# Patient Record
Sex: Female | Born: 1960 | Race: White | Hispanic: No | Smoking: Current every day smoker
Health system: Southern US, Community
[De-identification: ages and names within clinical notes are randomized; demographics above are authoritative.]

## PROBLEM LIST (undated history)

## (undated) DIAGNOSIS — Z8489 Family history of other specified conditions: Secondary | ICD-10-CM

## (undated) DIAGNOSIS — R519 Headache, unspecified: Secondary | ICD-10-CM

## (undated) DIAGNOSIS — E119 Type 2 diabetes mellitus without complications: Secondary | ICD-10-CM

## (undated) DIAGNOSIS — M1990 Unspecified osteoarthritis, unspecified site: Secondary | ICD-10-CM

## (undated) DIAGNOSIS — E7800 Pure hypercholesterolemia, unspecified: Secondary | ICD-10-CM

## (undated) DIAGNOSIS — Z972 Presence of dental prosthetic device (complete) (partial): Secondary | ICD-10-CM

## (undated) DIAGNOSIS — J189 Pneumonia, unspecified organism: Secondary | ICD-10-CM

## (undated) DIAGNOSIS — I10 Essential (primary) hypertension: Secondary | ICD-10-CM

## (undated) HISTORY — PX: COLONOSCOPY: SHX174

## (undated) HISTORY — PX: CHOLECYSTECTOMY: SHX55

## (undated) HISTORY — PX: ESOPHAGOGASTRODUODENOSCOPY: SHX1529

---

## 1995-08-09 HISTORY — PX: ABDOMINAL HYSTERECTOMY: SHX81

## 2005-08-08 HISTORY — PX: BREAST EXCISIONAL BIOPSY: SUR124

## 2011-08-09 HISTORY — PX: BREAST BIOPSY: SHX20

## 2019-01-03 ENCOUNTER — Other Ambulatory Visit: Payer: Self-pay | Admitting: Nurse Practitioner

## 2019-01-03 DIAGNOSIS — Z1231 Encounter for screening mammogram for malignant neoplasm of breast: Secondary | ICD-10-CM

## 2019-02-14 ENCOUNTER — Other Ambulatory Visit: Payer: Self-pay

## 2019-02-14 ENCOUNTER — Ambulatory Visit
Admission: RE | Admit: 2019-02-14 | Discharge: 2019-02-14 | Disposition: A | Discharge status: Home / Self Care | Payer: 59 | Source: Ambulatory Visit | Attending: Nurse Practitioner | Admitting: Nurse Practitioner

## 2019-02-14 DIAGNOSIS — Z1231 Encounter for screening mammogram for malignant neoplasm of breast: Secondary | ICD-10-CM | POA: Diagnosis present

## 2019-06-21 ENCOUNTER — Other Ambulatory Visit: Payer: Self-pay

## 2019-06-21 ENCOUNTER — Emergency Department: Payer: 59

## 2019-06-21 ENCOUNTER — Inpatient Hospital Stay: Payer: 59

## 2019-06-21 ENCOUNTER — Inpatient Hospital Stay
Admission: EM | Admit: 2019-06-21 | Discharge: 2019-06-25 | DRG: 872 | Disposition: A | Discharge status: Home / Self Care | Payer: 59 | Attending: Internal Medicine | Admitting: Internal Medicine

## 2019-06-21 DIAGNOSIS — Z6832 Body mass index (BMI) 32.0-32.9, adult: Secondary | ICD-10-CM | POA: Diagnosis not present

## 2019-06-21 DIAGNOSIS — B950 Streptococcus, group A, as the cause of diseases classified elsewhere: Secondary | ICD-10-CM | POA: Diagnosis present

## 2019-06-21 DIAGNOSIS — R030 Elevated blood-pressure reading, without diagnosis of hypertension: Secondary | ICD-10-CM | POA: Diagnosis present

## 2019-06-21 DIAGNOSIS — E669 Obesity, unspecified: Secondary | ICD-10-CM | POA: Diagnosis present

## 2019-06-21 DIAGNOSIS — J189 Pneumonia, unspecified organism: Secondary | ICD-10-CM | POA: Diagnosis not present

## 2019-06-21 DIAGNOSIS — Z803 Family history of malignant neoplasm of breast: Secondary | ICD-10-CM | POA: Diagnosis not present

## 2019-06-21 DIAGNOSIS — E119 Type 2 diabetes mellitus without complications: Secondary | ICD-10-CM | POA: Diagnosis present

## 2019-06-21 DIAGNOSIS — Z880 Allergy status to penicillin: Secondary | ICD-10-CM | POA: Diagnosis not present

## 2019-06-21 DIAGNOSIS — E785 Hyperlipidemia, unspecified: Secondary | ICD-10-CM | POA: Diagnosis present

## 2019-06-21 DIAGNOSIS — Z20828 Contact with and (suspected) exposure to other viral communicable diseases: Secondary | ICD-10-CM | POA: Diagnosis present

## 2019-06-21 DIAGNOSIS — E860 Dehydration: Secondary | ICD-10-CM | POA: Diagnosis present

## 2019-06-21 DIAGNOSIS — Z87891 Personal history of nicotine dependence: Secondary | ICD-10-CM

## 2019-06-21 DIAGNOSIS — H9319 Tinnitus, unspecified ear: Secondary | ICD-10-CM | POA: Diagnosis present

## 2019-06-21 DIAGNOSIS — E7800 Pure hypercholesterolemia, unspecified: Secondary | ICD-10-CM | POA: Diagnosis present

## 2019-06-21 DIAGNOSIS — R519 Headache, unspecified: Secondary | ICD-10-CM

## 2019-06-21 DIAGNOSIS — Z882 Allergy status to sulfonamides status: Secondary | ICD-10-CM

## 2019-06-21 DIAGNOSIS — E861 Hypovolemia: Secondary | ICD-10-CM | POA: Diagnosis present

## 2019-06-21 DIAGNOSIS — A409 Streptococcal sepsis, unspecified: Principal | ICD-10-CM | POA: Diagnosis present

## 2019-06-21 DIAGNOSIS — R3129 Other microscopic hematuria: Secondary | ICD-10-CM

## 2019-06-21 DIAGNOSIS — J020 Streptococcal pharyngitis: Secondary | ICD-10-CM | POA: Diagnosis present

## 2019-06-21 DIAGNOSIS — A419 Sepsis, unspecified organism: Secondary | ICD-10-CM

## 2019-06-21 HISTORY — DX: Pure hypercholesterolemia, unspecified: E78.00

## 2019-06-21 HISTORY — DX: Essential (primary) hypertension: I10

## 2019-06-21 HISTORY — DX: Type 2 diabetes mellitus without complications: E11.9

## 2019-06-21 LAB — COMPREHENSIVE METABOLIC PANEL
ALT: 23 U/L (ref 0–44)
AST: 25 U/L (ref 15–41)
Albumin: 4.5 g/dL (ref 3.5–5.0)
Alkaline Phosphatase: 86 U/L (ref 38–126)
Anion gap: 12 (ref 5–15)
BUN: 12 mg/dL (ref 6–20)
CO2: 22 mmol/L (ref 22–32)
Calcium: 9.5 mg/dL (ref 8.9–10.3)
Chloride: 104 mmol/L (ref 98–111)
Creatinine, Ser: 0.76 mg/dL (ref 0.44–1.00)
GFR calc Af Amer: >60 mL/min (ref >60)
GFR calc non Af Amer: >60 mL/min (ref >60)
Glucose, Bld: 149 mg/dL — ABNORMAL HIGH (ref 70–99)
Potassium: 3.8 mmol/L (ref 3.5–5.1)
Sodium: 138 mmol/L (ref 135–145)
Total Bilirubin: 1.2 mg/dL (ref 0.3–1.2)
Total Protein: 8.1 g/dL (ref 6.5–8.1)

## 2019-06-21 LAB — CBC WITH DIFFERENTIAL/PLATELET
Abs Immature Granulocytes: 0.09 10*3/uL — ABNORMAL HIGH (ref 0.00–0.07)
Basophils Absolute: 0 10*3/uL (ref 0.0–0.1)
Basophils Relative: 0 %
Eosinophils Absolute: 0 10*3/uL (ref 0.0–0.5)
Eosinophils Relative: 0 %
HCT: 41.9 % (ref 36.0–46.0)
Hemoglobin: 14.2 g/dL (ref 12.0–15.0)
Immature Granulocytes: 1 %
Lymphocytes Relative: 12 %
Lymphs Abs: 2.1 10*3/uL (ref 0.7–4.0)
MCH: 30.9 pg (ref 26.0–34.0)
MCHC: 33.9 g/dL (ref 30.0–36.0)
MCV: 91.1 fL (ref 80.0–100.0)
Monocytes Absolute: 1.1 10*3/uL — ABNORMAL HIGH (ref 0.1–1.0)
Monocytes Relative: 7 %
Neutro Abs: 13.8 10*3/uL — ABNORMAL HIGH (ref 1.7–7.7)
Neutrophils Relative %: 80 %
Platelets: 243 10*3/uL (ref 150–400)
RBC: 4.6 MIL/uL (ref 3.87–5.11)
RDW: 12.5 % (ref 11.5–15.5)
WBC: 17.1 10*3/uL — ABNORMAL HIGH (ref 4.0–10.5)
nRBC: 0 % (ref 0.0–0.2)

## 2019-06-21 LAB — URINALYSIS, COMPLETE (UACMP) WITH MICROSCOPIC
Bacteria, UA: NONE SEEN
Bilirubin Urine: NEGATIVE
Glucose, UA: NEGATIVE mg/dL
Hgb urine dipstick: NEGATIVE
Ketones, ur: NEGATIVE mg/dL
Leukocytes,Ua: NEGATIVE
Nitrite: NEGATIVE
Protein, ur: NEGATIVE mg/dL
Specific Gravity, Urine: 1.016 (ref 1.005–1.030)
WBC, UA: NONE SEEN WBC/hpf (ref 0–5)
pH: 7 (ref 5.0–8.0)

## 2019-06-21 LAB — LACTIC ACID, PLASMA
Lactic Acid, Venous: 1.8 mmol/L (ref 0.5–1.9)
Lactic Acid, Venous: 1.4 mmol/L (ref 0.5–1.9)

## 2019-06-21 LAB — PROTIME-INR
INR: 1 (ref 0.8–1.2)
Prothrombin Time: 13 seconds (ref 11.4–15.2)

## 2019-06-21 LAB — INFLUENZA PANEL BY PCR (TYPE A & B)
Influenza A By PCR: NEGATIVE
Influenza B By PCR: NEGATIVE

## 2019-06-21 LAB — MONONUCLEOSIS SCREEN: Mono Screen: NEGATIVE

## 2019-06-21 LAB — TROPONIN I (HIGH SENSITIVITY): Troponin I (High Sensitivity): 5 ng/L (ref <18)

## 2019-06-21 LAB — GROUP A STREP BY PCR: Group A Strep by PCR: DETECTED — AB

## 2019-06-21 LAB — BRAIN NATRIURETIC PEPTIDE: B Natriuretic Peptide: 27 pg/mL (ref 0.0–100.0)

## 2019-06-21 MED ORDER — SODIUM CHLORIDE 0.9 % IV SOLN
2.0000 g | INTRAVENOUS | Status: DC
  Administered 2019-06-22 – 2019-06-24 (×3): 2 g via INTRAVENOUS
  Filled 2019-06-21: qty 2
  Filled 2019-06-21: qty 20
  Filled 2019-06-21 (×2): qty 2

## 2019-06-21 MED ORDER — SODIUM CHLORIDE 0.9 % IV SOLN
500.0000 mg | INTRAVENOUS | Status: DC
  Administered 2019-06-22 – 2019-06-24 (×3): 500 mg via INTRAVENOUS
  Filled 2019-06-21 (×4): qty 500

## 2019-06-21 MED ORDER — GUAIFENESIN ER 600 MG PO TB12
600.0000 mg | ORAL_TABLET | Freq: Two times a day (BID) | ORAL | Status: DC
  Administered 2019-06-22 – 2019-06-25 (×6): 600 mg via ORAL
  Filled 2019-06-21 (×7): qty 1

## 2019-06-21 MED ORDER — ONDANSETRON HCL 4 MG/2ML IJ SOLN
4.0000 mg | Freq: Four times a day (QID) | INTRAMUSCULAR | Status: DC | PRN
  Administered 2019-06-22 – 2019-06-23 (×2): 4 mg via INTRAVENOUS
  Filled 2019-06-21 (×2): qty 2

## 2019-06-21 MED ORDER — SODIUM CHLORIDE 0.9 % IV SOLN
1.0000 g | Freq: Once | INTRAVENOUS | Status: AC
  Administered 2019-06-21: 1 g via INTRAVENOUS
  Filled 2019-06-21: qty 10

## 2019-06-21 MED ORDER — TRAZODONE HCL 50 MG PO TABS: 25.0000 mg | ORAL_TABLET | Freq: Every evening | ORAL | Status: DC | PRN

## 2019-06-21 MED ORDER — SODIUM CHLORIDE 0.9 % IV SOLN
INTRAVENOUS | Status: DC
  Administered 2019-06-22 – 2019-06-25 (×5): via INTRAVENOUS

## 2019-06-21 MED ORDER — SODIUM CHLORIDE 0.9 % IV BOLUS
1000.0000 mL | Freq: Once | INTRAVENOUS | Status: AC
  Administered 2019-06-21: 22:00:00 1000 mL via INTRAVENOUS

## 2019-06-21 MED ORDER — SODIUM CHLORIDE 0.9 % IV SOLN
500.0000 mg | Freq: Once | INTRAVENOUS | Status: AC
  Administered 2019-06-21: 500 mg via INTRAVENOUS
  Filled 2019-06-21: qty 500

## 2019-06-21 MED ORDER — MAGNESIUM HYDROXIDE 400 MG/5ML PO SUSP: 30.0000 mL | Freq: Every day | ORAL | Status: DC | PRN

## 2019-06-21 MED ORDER — ACETAMINOPHEN 325 MG PO TABS
650.0000 mg | ORAL_TABLET | Freq: Once | ORAL | Status: AC | PRN
  Administered 2019-06-21: 15:00:00 650 mg via ORAL
  Filled 2019-06-21: qty 2

## 2019-06-21 MED ORDER — PHENOL 1.4 % MT LIQD
1.0000 | OROMUCOSAL | Status: DC | PRN
  Filled 2019-06-21: qty 177

## 2019-06-21 MED ORDER — ACETAMINOPHEN 650 MG RE SUPP: 650.0000 mg | Freq: Four times a day (QID) | RECTAL | Status: DC | PRN

## 2019-06-21 MED ORDER — HYDROMORPHONE HCL 1 MG/ML IJ SOLN
0.5000 mg | Freq: Once | INTRAMUSCULAR | Status: AC
  Administered 2019-06-21: 21:00:00 0.5 mg via INTRAVENOUS
  Filled 2019-06-21: qty 1

## 2019-06-21 MED ORDER — HYDROCOD POLST-CPM POLST ER 10-8 MG/5ML PO SUER: 5.0000 mL | Freq: Two times a day (BID) | ORAL | Status: DC | PRN

## 2019-06-21 MED ORDER — ONDANSETRON HCL 4 MG/2ML IJ SOLN
4.0000 mg | Freq: Once | INTRAMUSCULAR | Status: AC
  Administered 2019-06-21: 18:00:00 4 mg via INTRAVENOUS
  Filled 2019-06-21: qty 2

## 2019-06-21 MED ORDER — LABETALOL HCL 5 MG/ML IV SOLN: 20.0000 mg | INTRAVENOUS | Status: DC | PRN

## 2019-06-21 MED ORDER — ACETAMINOPHEN 325 MG PO TABS
ORAL_TABLET | ORAL | Status: AC
  Administered 2019-06-21: 20:00:00 650 mg via ORAL
  Filled 2019-06-21: qty 2

## 2019-06-21 MED ORDER — INSULIN ASPART 100 UNIT/ML ~~LOC~~ SOLN
0.0000 [IU] | Freq: Three times a day (TID) | SUBCUTANEOUS | Status: DC
  Administered 2019-06-22: 13:00:00 1 [IU] via SUBCUTANEOUS
  Administered 2019-06-23: 17:00:00 2 [IU] via SUBCUTANEOUS
  Administered 2019-06-24: 18:00:00 3 [IU] via SUBCUTANEOUS
  Administered 2019-06-24 – 2019-06-25 (×3): 2 [IU] via SUBCUTANEOUS
  Administered 2019-06-25: 12:00:00 1 [IU] via SUBCUTANEOUS
  Filled 2019-06-21 (×7): qty 1

## 2019-06-21 MED ORDER — MORPHINE SULFATE (PF) 2 MG/ML IV SOLN
2.0000 mg | INTRAVENOUS | Status: DC | PRN
  Administered 2019-06-22 – 2019-06-24 (×4): 2 mg via INTRAVENOUS
  Filled 2019-06-21 (×4): qty 1

## 2019-06-21 MED ORDER — HYDROMORPHONE HCL 1 MG/ML IJ SOLN
0.5000 mg | Freq: Once | INTRAMUSCULAR | Status: AC
  Administered 2019-06-21: 19:00:00 0.5 mg via INTRAVENOUS
  Filled 2019-06-21: qty 1

## 2019-06-21 MED ORDER — SODIUM CHLORIDE 0.9 % IV BOLUS
1000.0000 mL | Freq: Once | INTRAVENOUS | Status: AC
  Administered 2019-06-21: 16:00:00 1000 mL via INTRAVENOUS

## 2019-06-21 MED ORDER — ACETAMINOPHEN 325 MG PO TABS
650.0000 mg | ORAL_TABLET | Freq: Four times a day (QID) | ORAL | Status: DC | PRN
  Administered 2019-06-21 – 2019-06-24 (×4): 650 mg via ORAL
  Filled 2019-06-21 (×3): qty 2

## 2019-06-21 MED ORDER — MORPHINE SULFATE (PF) 4 MG/ML IV SOLN
4.0000 mg | Freq: Once | INTRAVENOUS | Status: AC
  Administered 2019-06-21: 18:00:00 4 mg via INTRAVENOUS
  Filled 2019-06-21: qty 1

## 2019-06-21 MED ORDER — ENOXAPARIN SODIUM 40 MG/0.4ML ~~LOC~~ SOLN
40.0000 mg | SUBCUTANEOUS | Status: DC
  Administered 2019-06-22 – 2019-06-25 (×4): 40 mg via SUBCUTANEOUS
  Filled 2019-06-21 (×4): qty 0.4

## 2019-06-21 MED ORDER — ONDANSETRON HCL 4 MG PO TABS: 4.0000 mg | ORAL_TABLET | Freq: Four times a day (QID) | ORAL | Status: DC | PRN

## 2019-06-21 NOTE — ED Notes (Signed)
Pt given ice water.

## 2019-06-21 NOTE — ED Notes (Signed)
Pt ambulated to toilet, pt reports she feels very weak, assisted her back to bed

## 2019-06-21 NOTE — ED Notes (Signed)
Pt reports inability to take a deep breath in due to pain, states taking a deep breath in makes her cough which makes her head hurt. Sat patient's bed up and placed pt on 2L [place], O2 97% on O2 at this time. Dr. Cinda Quest informed

## 2019-06-21 NOTE — ED Notes (Signed)
Pt reports her headache has not improved with morphine, Dr. Cinda Quest informed

## 2019-06-21 NOTE — H&P (Addendum)
[place] [facility] [place] NAME: Nancy Campos    MR#:  [id number]  DATE OF BIRTH:  1960-11-13  DATE OF ADMISSION:  06/21/2019  PRIMARY CARE PHYSICIAN: Perrin Maltese, MD   REQUESTING/REFERRING PHYSICIAN: Conni Slipper, MD  CHIEF COMPLAINT:   Chief Complaint  Patient presents with   Fever   Sore Throat   Cough   Generalized Body Aches    HISTORY OF PRESENT ILLNESS:  Nancy Campos  is a 58 y.o. Caucasian female with a known history of type 2 diabetes mellitus, hypertension dyslipidemia and ongoing tobacco abuse who presented to the emergency room with acute onset of sore throat that started this morning with associated high fever of 100.2, headache and backache as well as nausea without vomiting or diarrhea or abdominal pain.  She admitted to odynophagia as well as cough with inability to expectorate.  She stated that she would have severe headache with her cough.  She admitted to mild rhinorrhea and nasal congestion as well as tinnitus.  She has been feeling significantly weak.  She admitted to dyspnea with her symptoms.  In the ER she had mild dysuria without urinary frequency or urgency or gross hematuria.  She denied any orthopnea or paroxysmal nocturnal dyspnea or lower extremity edema.  Upon presentation to the emergency room, temperature was 103.1 with a blood pressure 148/84, heart rate of 126 and respiratory to 24 with pulse ox of 99% on room air.  Labs revealed blood glucose of 149 with otherwise unremarkable CMP.  BNP was 27.  CBC showed leukocytosis with neutrophilia and ANC/ALC was 13.8/2.1.  Lactic acid was 1.8 and later 1.4 and high-sensitivity troponin I was 5 and later 4.  Influenza AMB antigen came back negative.  COVID-19 test is currently pending.  Urinalysis showed microscopic materia with 15-20 RBCs and no WBCs.  Nitrite was negative.  Her group a strep by PCR came back positive  The patient was given IV Rocephin and Zithromax, 650 mg p.o.  Tylenol, 0.5 mg IV Dilaudid and 4 mg of IV morphine sulfate as well as 4 mg of IV Zofran and 1 L bolus of IV normal saline.  She will be admitted to medical monitored bed for further evaluation and management. PAST MEDICAL HISTORY:   Past Medical History:  Diagnosis Date   Diabetes mellitus without complication ([place])    Hypercholesteremia    Hypertension     PAST SURGICAL HISTORY:   Past Surgical History:  Procedure Laterality Date   BREAST BIOPSY Right 2013   benign   BREAST EXCISIONAL BIOPSY Right 2007   benign    SOCIAL HISTORY:   Social History   Tobacco Use   Smoking status: Not on file  Substance Use Topics   Alcohol use: Not on file    FAMILY HISTORY:   Family History  Problem Relation Age of Onset   Breast cancer Other     DRUG ALLERGIES:   Allergies  Allergen Reactions   Penicillins    Sulfa Antibiotics     REVIEW OF SYSTEMS:   ROS As per history of present illness. All pertinent systems were reviewed above. Constitutional,  HEENT, cardiovascular, respiratory, GI, GU, musculoskeletal, neuro, psychiatric, endocrine,  integumentary and hematologic systems were reviewed and are otherwise  negative/unremarkable except for positive findings mentioned above in the HPI.   MEDICATIONS AT HOME:   Prior to Admission medications   Not on File      VITAL SIGNS:  Blood pressure Marland Kitchen)  148/84, pulse 95, temperature 99.4 F (37.4 C), temperature source Oral, resp. rate 20, height 5\' 5"  (1.651 m), weight 88.5 kg, SpO2 99 %.  PHYSICAL EXAMINATION:  Physical Exam  GENERAL: Ill looking 58 y.o.-year-old Caucasian female patient lying in the bed in mild respiratory distress with conversational dyspnea as well as distress from headache.  EYES: Pupils equal, round, reactive to light and accommodation. No scleral icterus. Extraocular muscles intact.  HEENT: Head atraumatic, normocephalic. Oropharynx with bilateral erythematous swollen tonsils and mild  pharyngeal erythema without clear exudate and nasopharynx clear.  NECK:  Supple, no jugular venous distention. No thyroid enlargement, no tenderness.  LUNGS: Diminished bibasal breath sounds with right basal crackles. CARDIOVASCULAR: Regular rate and rhythm, S1, S2 normal. No murmurs, rubs, or gallops.  ABDOMEN: Soft, nondistended, nontender. Bowel sounds present. No organomegaly or mass.  EXTREMITIES: No pedal edema, cyanosis, or clubbing.  NEUROLOGIC: Cranial nerves II through XII are intact. Muscle strength 5/5 in all extremities. Sensation intact. Gait not checked.  PSYCHIATRIC: The patient is alert and oriented x 3.  Normal affect and good eye contact. SKIN: No obvious rash, lesion, or ulcer.   LABORATORY PANEL:   CBC Recent Labs  Lab 06/21/19 1508  WBC 17.1*  HGB 14.2  HCT 41.9  PLT 243   ------------------------------------------------------------------------------------------------------------------  Chemistries  Recent Labs  Lab 06/21/19 1508  NA 138  K 3.8  CL 104  CO2 22  GLUCOSE 149*  BUN 12  CREATININE 0.76  CALCIUM 9.5  AST 25  ALT 23  ALKPHOS 86  BILITOT 1.2   ------------------------------------------------------------------------------------------------------------------  Cardiac Enzymes No results for input(s): TROPONINI in the last 168 hours. ------------------------------------------------------------------------------------------------------------------  RADIOLOGY:  Dg Chest 2 View  Result Date: 06/21/2019 CLINICAL DATA:  Suspected sepsis EXAM: CHEST - 2 VIEW COMPARISON:  None. FINDINGS: Mild cardiomegaly. Mild, diffuse interstitial pulmonary opacity. The visualized skeletal structures are unremarkable. IMPRESSION: 1. Mild, diffuse bilateral interstitial pulmonary opacity, which may reflect atypical or viral infection as well as edema. No focal airspace opacity. 2.  Cardiomegaly. Electronically Signed   By: Eddie Candle M.D.   On: 06/21/2019  15:45   Ct Renal Stone Study  Result Date: 06/21/2019 CLINICAL DATA:  Hematuria, sepsis. Headache, body aches cough. EXAM: CT ABDOMEN AND PELVIS WITHOUT CONTRAST TECHNIQUE: Multidetector CT imaging of the abdomen and pelvis was performed following the standard protocol without IV contrast. COMPARISON:  None. FINDINGS: Lower chest: No sign of consolidation or evidence of pleural effusion. Hepatobiliary: Mild hepatic steatosis. No signs of focal lesion on noncontrast exam. Focal fatty sparing at the gallbladder fossa. Post cholecystectomy. Mild extrahepatic biliary ductal distension may reflect post cholecystectomy baseline. Pancreas: Unremarkable. No pancreatic ductal dilatation or surrounding inflammatory changes. Spleen: Granulomas in the spleen. Adrenals/Urinary Tract: Adrenal glands are normal. Kidneys with normal contour. No signs of hydronephrosis. Stomach/Bowel: Small hiatal hernia. No signs of acute bowel process. The appendix is normal. Vascular/Lymphatic: Scattered atherosclerosis. No signs of aneurysm. No signs of retroperitoneal or upper abdominal lymphadenopathy. No signs of pelvic lymphadenopathy. Reproductive: Post hysterectomy. Other: No signs of free air. No ascites. Musculoskeletal: No signs of acute bone finding or evidence of destructive bone process. IMPRESSION: 1. No acute findings in the abdomen or pelvis. 2. Post hysterectomy and cholecystectomy. 3. Signs of hepatic steatosis. Electronically Signed   By: Zetta Bills M.D.   On: 06/21/2019 19:28      IMPRESSION AND PLAN:   1.  Streptococcal pharyngitis and associated sepsis.  The patient will be admitted to a  medical monitored bed.  She will be placed on continued antibiotic therapy with IV Rocephin.  Will follow throat culture as well as blood cultures.  She will be hydrated with IV normal saline.  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  Will obtain bilateral renal  ultrasound for further assessment.  She had no gross hematuria.  2.  Suspected community-acquired pneumonia.  This could be streptococcal as well.  We will obtain blood and sputum Gram stain culture and sensitivity as well as pneumonia antigens.  Pending results we will place the patient on IV Rocephin and Zithromax.  Given the atypical nature of her pneumonia we will obtain inflammatory markers.  Her COVID-19 test is currently pending.  3.  Severe headache described as the worst headache of her life and worsening with cough.  Will obtain a stat noncontrasted head CT scan for further assessment.  Pain management will be provided with as needed IV morphine sulfate..  4.  Hypertension.  She will be placed on as needed IV labetalol.  5.  Type II Diabetes mellitus.  She will be placed on supplement coverage with NovoLog.  6.  Dyslipidemia.  Continue her antihyperlipidemics.  7.  DVT prophylaxis.  Subcutaneous Lovenox.    All the records are reviewed and case discussed with ED provider. The plan of care was discussed in details with the patient (and family). I answered all questions. The patient agreed to proceed with the above mentioned plan. Further management will depend upon hospital course.   CODE STATUS: Full code  TOTAL TIME TAKING CARE OF THIS PATIENT: 55 minutes.    Christel Mormon M.D on 06/21/2019 at 7:35 PM  [facility]   From 7 PM-7 AM, contact night-coverage [email or web address]  CC: Primary care physician; Perrin Maltese, MD   Note: This dictation was prepared with Dragon dictation along with smaller phrase technology. Any transcriptional errors that result from this process are unintentional.

## 2019-06-21 NOTE — ED Notes (Signed)
Red top tube sent to lab

## 2019-06-21 NOTE — ED Provider Notes (Signed)
[facility] Emergency Department Provider Note   ____________________________________________   First MD Initiated Contact with Patient 06/21/19 1713     (approximate)  I have reviewed the triage vital signs and the nursing notes.   HISTORY  Chief Complaint Fever, Sore Throat, Cough, and Generalized Body Aches    HPI Nancy Campos is a 58 y.o. female who reports she was fine this morning developed a small sore throat thought it would get better but it got worse and worse and now she is got a cough and a fever when she coughs her head hurts very badly.  She says she is aching all over as well.  Pain is severe again achy made worse with movement or coughing.        Past Medical History:  Diagnosis Date  . Diabetes mellitus without complication ([place])   . Hypercholesteremia   . Hypertension     Patient Active Problem List   Diagnosis Date Noted  . Streptococcal pharyngitis 06/21/2019    Past Surgical History:  Procedure Laterality Date  . BREAST BIOPSY Right 2013   benign  . BREAST EXCISIONAL BIOPSY Right 2007   benign    Prior to Admission medications   Not on File    Allergies Penicillins and Sulfa antibiotics  Family History  Problem Relation Age of Onset  . Breast cancer Other     Social History Social History   Tobacco Use  . Smoking status: Not on file  Substance Use Topics  . Alcohol use: Not on file  . Drug use: Not on file    Review of Systems  Constitutional:  fever/chills Eyes: No visual changes. ENT: No sore throat. Cardiovascular: Denies chest pain. Respiratory: Denies shortness of breath. Gastrointestinal: No abdominal pain.  No nausea, no vomiting.  No diarrhea.  No constipation. Genitourinary: Negative for dysuria. Musculoskeletal: Negative for back pain. Skin: Negative for rash. Neurological: Negative for headaches, focal weakness  No particular back or chest pain but aches all over and is worse when  she coughs ____________________________________________   PHYSICAL EXAM:  VITAL SIGNS: ED Triage Vitals  Enc Vitals Group     BP 06/21/19 1506 (!) 148/84     Pulse Rate 06/21/19 1506 (!) 126     Resp 06/21/19 1506 (!) 24     Temp 06/21/19 1506 (!) 103.1 F (39.5 C)     Temp Source 06/21/19 1506 Oral     SpO2 06/21/19 1506 99 %     Weight 06/21/19 1507 195 lb (88.5 kg)     Height 06/21/19 1507 5\' 5"  (1.651 m)     Head Circumference --      Peak Flow --      Pain Score 06/21/19 1506 10     Pain Loc --      Pain Edu? --      Excl. in [place]? --     Constitutional: Alert and oriented. Ill appearing and in acute distress. Eyes: Conjunctivae are normal. PER. EOMI. Head: Atraumatic. Nose: No congestion/rhinnorhea. Mouth/Throat: Mucous membranes are moist.  Oropharynx non-erythematous. Neck: No stridor.  Neck is supple  cardiovascular: Rapid rate, regular rhythm. Grossly normal heart sounds.  Good peripheral circulation. Respiratory: Normal respiratory effort.  No retractions. Lungs CTAB. Gastrointestinal: Soft and nontender. No distention. No abdominal bruits. No CVA tenderness. Musculoskeletal: No lower extremity tenderness nor edema.   Neurologic:  Normal speech and language. No gross focal neurologic deficits are appreciated.  Skin:  Skin is warm, dry  and intact. No rash noted.   ____________________________________________   LABS (all labs ordered are listed, but only abnormal results are displayed)  Labs Reviewed  GROUP A STREP BY PCR - Abnormal; Notable for the following components:      Result Value   Group A Strep by PCR DETECTED (*)    All other components within normal limits  COMPREHENSIVE METABOLIC PANEL - Abnormal; Notable for the following components:   Glucose, Bld 149 (*)    All other components within normal limits  CBC WITH DIFFERENTIAL/PLATELET - Abnormal; Notable for the following components:   WBC 17.1 (*)    Neutro Abs 13.8 (*)    Monocytes Absolute  1.1 (*)    Abs Immature Granulocytes 0.09 (*)    All other components within normal limits  URINALYSIS, COMPLETE (UACMP) WITH MICROSCOPIC - Abnormal; Notable for the following components:   Color, Urine YELLOW (*)    APPearance CLEAR (*)    All other components within normal limits  CULTURE, BLOOD (ROUTINE X 2)  CULTURE, BLOOD (ROUTINE X 2)  SARS CORONAVIRUS 2 (TAT 6-24 HRS)  CULTURE, GROUP A STREP ([place])  EXPECTORATED SPUTUM ASSESSMENT W REFEX TO RESP CULTURE  LACTIC ACID, PLASMA  LACTIC ACID, PLASMA  PROTIME-INR  INFLUENZA PANEL BY PCR (TYPE A & B)  BRAIN NATRIURETIC PEPTIDE  MONONUCLEOSIS SCREEN  HIV ANTIBODY (ROUTINE TESTING W REFLEX)  TSH  HEMOGLOBIN 123XX123  BASIC METABOLIC PANEL  CBC  C-REACTIVE PROTEIN  FERRITIN  LACTATE DEHYDROGENASE  LEGIONELLA PNEUMOPHILA SEROGP 1 UR AG  STREP PNEUMONIAE URINARY ANTIGEN  MYCOPLASMA PNEUMONIAE ANTIBODY, IGM  TROPONIN I (HIGH SENSITIVITY)  TROPONIN I (HIGH SENSITIVITY)  TROPONIN I (HIGH SENSITIVITY)   ____________________________________________  EKG  EKG read interpreted by me shows sinus tachycardia at a rate of 112 normal axis nonspecific ST-T wave changes diffusely __________________________________________  RADIOLOGY  ED MD interpretation: Chest x-ray read by radiology reviewed by me shows possible mild diffuse bilateral interstitial pulmonary markings which may reflect atypical or viral infection as noted below.  These are very faint.  Official radiology report(s): Dg Chest 2 View  Result Date: 06/21/2019 CLINICAL DATA:  Suspected sepsis EXAM: CHEST - 2 VIEW COMPARISON:  None. FINDINGS: Mild cardiomegaly. Mild, diffuse interstitial pulmonary opacity. The visualized skeletal structures are unremarkable. IMPRESSION: 1. Mild, diffuse bilateral interstitial pulmonary opacity, which may reflect atypical or viral infection as well as edema. No focal airspace opacity. 2.  Cardiomegaly. Electronically Signed   By: Eddie Candle M.D.    On: 06/21/2019 15:45   Ct Head Wo Contrast  Result Date: 06/21/2019 CLINICAL DATA:  Headache, fever EXAM: CT HEAD WITHOUT CONTRAST TECHNIQUE: Contiguous axial images were obtained from the base of the skull through the vertex without intravenous contrast. COMPARISON:  None. FINDINGS: Brain: No acute intracranial abnormality. Specifically, no hemorrhage, hydrocephalus, mass lesion, acute infarction, or significant intracranial injury. Vascular: No hyperdense vessel or unexpected calcification. Skull: No acute calvarial abnormality. Sinuses/Orbits: Mucosal thickening throughout the paranasal sinuses. Air-fluid level in the left maxillary sinus. Other: None IMPRESSION: No intracranial abnormality. Acute on chronic sinusitis. Electronically Signed   By: Rolm Baptise M.D.   On: 06/21/2019 21:11   Ct Renal Stone Study  Result Date: 06/21/2019 CLINICAL DATA:  Hematuria, sepsis. Headache, body aches cough. EXAM: CT ABDOMEN AND PELVIS WITHOUT CONTRAST TECHNIQUE: Multidetector CT imaging of the abdomen and pelvis was performed following the standard protocol without IV contrast. COMPARISON:  None. FINDINGS: Lower chest: No sign of consolidation or evidence of pleural  effusion. Hepatobiliary: Mild hepatic steatosis. No signs of focal lesion on noncontrast exam. Focal fatty sparing at the gallbladder fossa. Post cholecystectomy. Mild extrahepatic biliary ductal distension may reflect post cholecystectomy baseline. Pancreas: Unremarkable. No pancreatic ductal dilatation or surrounding inflammatory changes. Spleen: Granulomas in the spleen. Adrenals/Urinary Tract: Adrenal glands are normal. Kidneys with normal contour. No signs of hydronephrosis. Stomach/Bowel: Small hiatal hernia. No signs of acute bowel process. The appendix is normal. Vascular/Lymphatic: Scattered atherosclerosis. No signs of aneurysm. No signs of retroperitoneal or upper abdominal lymphadenopathy. No signs of pelvic lymphadenopathy. Reproductive:  Post hysterectomy. Other: No signs of free air. No ascites. Musculoskeletal: No signs of acute bone finding or evidence of destructive bone process. IMPRESSION: 1. No acute findings in the abdomen or pelvis. 2. Post hysterectomy and cholecystectomy. 3. Signs of hepatic steatosis. Electronically Signed   By: Zetta Bills M.D.   On: 06/21/2019 19:28    ____________________________________________   PROCEDURES  Procedure(s) performed (including Critical Care):  Procedures   ____________________________________________   INITIAL IMPRESSION / ASSESSMENT AND PLAN / ED COURSE  Patient with a fever and high white blood count.  High white blood count makes Covid a little less likely.  We will give her something for her headache is a Tylenol is not quite doing the trick.              ____________________________________________   FINAL CLINICAL IMPRESSION(S) / ED DIAGNOSES  Final diagnoses:  Sepsis, due to unspecified organism, unspecified whether acute organ dysfunction present [facility])     ED Discharge Orders    None       Note:  This document was prepared using Dragon voice recognition software and may include unintentional dictation errors.    Nena Polio, MD 06/21/19 [phone]

## 2019-06-21 NOTE — ED Triage Notes (Addendum)
Pt states that she awoke today with low grade fever and sore throat. Pt states that she sat in line for 4 hours to have drive thru COVID testing then was sent to ER without test. Pt reports headache, body aches, cough, sore throat, fever. Pt tearful. RR even and unlabored at rest, reports exertional SOB.  Also reporting chest tightness at rest.

## 2019-06-21 NOTE — ED Notes (Signed)
Verbal given by Dr. Cinda Quest to give 1 liter NS bolus

## 2019-06-21 NOTE — ED Notes (Signed)
Pt's temp 103.1, Dr. Cinda Quest informed and states ok for me to give her tylenol 650mg  before the 6 hour mark. PT given tylenol 650 mg PO by this RN at this time

## 2019-06-21 NOTE — ED Notes (Signed)
Pt otf for imaging 

## 2019-06-22 ENCOUNTER — Inpatient Hospital Stay: Payer: 59

## 2019-06-22 LAB — TSH: TSH: 1.018 u[IU]/mL (ref 0.350–4.500)

## 2019-06-22 LAB — SARS CORONAVIRUS 2 (TAT 6-24 HRS): SARS Coronavirus 2: NEGATIVE

## 2019-06-22 LAB — TROPONIN I (HIGH SENSITIVITY)
Troponin I (High Sensitivity): 4 ng/L (ref <18)
Troponin I (High Sensitivity): 9 ng/L (ref <18)

## 2019-06-22 LAB — CBC
HCT: 37 % (ref 36.0–46.0)
Hemoglobin: 12.4 g/dL (ref 12.0–15.0)
MCH: 31.2 pg (ref 26.0–34.0)
MCHC: 33.5 g/dL (ref 30.0–36.0)
MCV: 93 fL (ref 80.0–100.0)
Platelets: 174 10*3/uL (ref 150–400)
RBC: 3.98 MIL/uL (ref 3.87–5.11)
RDW: 12.5 % (ref 11.5–15.5)
WBC: 17.2 10*3/uL — ABNORMAL HIGH (ref 4.0–10.5)
nRBC: 0 % (ref 0.0–0.2)

## 2019-06-22 LAB — GLUCOSE, CAPILLARY
Glucose-Capillary: 121 mg/dL — ABNORMAL HIGH (ref 70–99)
Glucose-Capillary: 133 mg/dL — ABNORMAL HIGH (ref 70–99)
Glucose-Capillary: 121 mg/dL — ABNORMAL HIGH (ref 70–99)
Glucose-Capillary: 132 mg/dL — ABNORMAL HIGH (ref 70–99)

## 2019-06-22 LAB — BASIC METABOLIC PANEL
Anion gap: 9 (ref 5–15)
BUN: 9 mg/dL (ref 6–20)
CO2: 23 mmol/L (ref 22–32)
Calcium: 8.4 mg/dL — ABNORMAL LOW (ref 8.9–10.3)
Chloride: 104 mmol/L (ref 98–111)
Creatinine, Ser: 0.67 mg/dL (ref 0.44–1.00)
GFR calc Af Amer: >60 mL/min (ref >60)
GFR calc non Af Amer: >60 mL/min (ref >60)
Glucose, Bld: 173 mg/dL — ABNORMAL HIGH (ref 70–99)
Potassium: 3.8 mmol/L (ref 3.5–5.1)
Sodium: 136 mmol/L (ref 135–145)

## 2019-06-22 LAB — STREP PNEUMONIAE URINARY ANTIGEN: Strep Pneumo Urinary Antigen: NEGATIVE

## 2019-06-22 LAB — HEMOGLOBIN A1C
Hgb A1c MFr Bld: 6.6 % — ABNORMAL HIGH (ref 4.8–5.6)
Mean Plasma Glucose: 142.72 mg/dL

## 2019-06-22 LAB — HIV ANTIBODY (ROUTINE TESTING W REFLEX): HIV Screen 4th Generation wRfx: NONREACTIVE

## 2019-06-22 LAB — LACTATE DEHYDROGENASE: LDH: 163 U/L (ref 98–192)

## 2019-06-22 LAB — C-REACTIVE PROTEIN: CRP: 11.3 mg/dL — ABNORMAL HIGH (ref <1.0)

## 2019-06-22 LAB — FERRITIN: Ferritin: 147 ng/mL (ref 11–307)

## 2019-06-22 MED ORDER — HYDROCOD POLST-CPM POLST ER 10-8 MG/5ML PO SUER
5.0000 mL | Freq: Two times a day (BID) | ORAL | Status: DC
  Administered 2019-06-22 – 2019-06-23 (×4): 5 mL via ORAL
  Filled 2019-06-22 (×4): qty 5

## 2019-06-22 NOTE — ED Notes (Signed)
Secure message sent to Dr Nevada Crane to change pt's admission order d/t pt being covid neg- Dr Nevada Crane states she will change it

## 2019-06-22 NOTE — ED Notes (Signed)
Ultrasound at bedside

## 2019-06-22 NOTE — ED Notes (Signed)
Patient assisted to restroom, complains of back pain and headache, tylenol given. Patient stable

## 2019-06-22 NOTE — ED Notes (Signed)
Pt given breakfast tray and instructed to let nurse know if she cannot eat the hard food after pain medication

## 2019-06-22 NOTE — ED Notes (Signed)
Sent pharmacy a message about verifying and sending medications

## 2019-06-22 NOTE — ED Notes (Signed)
Pt had eaten none of her breakfast- gave pt some applesauce to encourage eating- pt states she cannot swallow with throat sore

## 2019-06-22 NOTE — ED Notes (Signed)
Sent pharmacy a message for antibiotics

## 2019-06-22 NOTE — Progress Notes (Signed)
PROGRESS NOTE  Nancy Campos [id number] DOB: 10-24-1960 DOA: 06/21/2019 PCP: Perrin Maltese, MD  HPI/Recap of past 24 hours: Nancy Campos  is a 58 y.o. Caucasian female with a known history of type 2 diabetes mellitus, hypertension dyslipidemia and ongoing tobacco abuse who presented to the emergency room with acute onset of sore throat that started this morning with associated high fever of 100.2, headache and backache as well as nausea without vomiting or diarrhea or abdominal pain.  She admitted to odynophagia as well as cough with inability to expectorate.  She stated that she would have severe headache with her cough.  She admitted to mild rhinorrhea and nasal congestion as well as tinnitus.  She has been feeling significantly weak.  She admitted to dyspnea with her symptoms.  In the ER she had mild dysuria without urinary frequency or urgency or gross hematuria.  She denied any orthopnea or paroxysmal nocturnal dyspnea or lower extremity edema.  Upon presentation to the emergency room, temperature was 103.1 with a blood pressure 148/84, heart rate of 126 and respiratory to 24 with pulse ox of 99% on room air.  Labs revealed blood glucose of 149 with otherwise unremarkable CMP.  BNP was 27.  CBC showed leukocytosis with neutrophilia and ANC/ALC was 13.8/2.1.  Lactic acid was 1.8 and later 1.4 and high-sensitivity troponin I was 5 and later 4.  Influenza AMB antigen came back negative.  COVID-19 test is currently pending.  Urinalysis showed microscopic materia with 15-20 RBCs and no WBCs.  Nitrite was negative.  Her group a strep by PCR came back positive  The patient was given IV Rocephin and Zithromax, 650 mg p.o. Tylenol, 0.5 mg IV Dilaudid and 4 mg of IV morphine sulfate as well as 4 mg of IV Zofran and 1 L bolus of IV normal saline.  She will be admitted to medical monitored bed for further evaluation and management.  06/22/19: Seen and examined at her bedside in the ED. Odynophagia and  non productive cough associated with pruritic pain. Ordered tussionex and continued IV morphine prn for severe pain.  Assessment/Plan: Active Problems:   Streptococcal pharyngitis  1.  Sepsis 2/2 Streptococcal pharyngitis  Presented with fever Tmax 103 and leukocytosis C/ Rocephin empirically.   Monitor fever curve and wbc Continue to follow cultures Will follow throat culture as well as blood cultures.  She will be hydrated with IV normal saline.  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    Bilateral renal ultrasound unrevealing.  Continue symptoms management Start Tussionex Continue IV morphine as needed for severe pain  2.  Suspected community-acquired pneumonia.  This could be streptococcal as well.   Cultures in process   Pending results we will place the patient on IV Rocephin and Zithromax.  Given the atypical nature of her pneumonia we will obtain inflammatory markers.   COVID-19 negative.   3.  Severe headache described as the worst headache of her life and worsening with cough.   CT head unremarkable for any acute intracranial findings.   Continue to treat symptomatically as directed  4.  Hypertension.  She will be placed on as needed IV labetalol.  5.  Type II Diabetes mellitus.    Hemoglobin A1c 6.6 on 06/22/2019.  Continue insulin sliding scale  6.  Dyslipidemia.    Not on medication  Acute dehydration in the setting of poor oral intake Continue IV fluid hydration  DVT prophylaxis.  Subcutaneous Lovenox daily.  Objective: Vitals:   06/22/19 1100 06/22/19 1130 06/22/19 1230 06/22/19 1330  BP: (!) 141/66 (!) 158/78 (!) 160/78 (!) 173/70  Pulse: 72 70 71 73  Resp:      Temp:      TempSrc:      SpO2: 97% 94% 97% 98%  Weight:      Height:       No intake or output data in the 24 hours ending 06/22/19 1500 Filed Weights   06/21/19 1507  Weight: 88.5 kg    Exam:  . General: 58 y.o.  year-old female well developed well nourished in no acute distress.  Alert and oriented x4. . Cardiovascular: Regular rate and rhythm with no rubs or gallops.  No thyromegaly or JVD noted.   Marland Kitchen Respiratory: Clear to auscultation with no wheezes or rales. Good inspiratory effort. . Abdomen: Soft nontender nondistended with normal bowel sounds x4 quadrants. . Musculoskeletal: No lower extremity edema. 2/4 pulses in all 4 extremities. Marland Kitchen Psychiatry: Mood is appropriate for condition and setting   Data Reviewed: CBC: Recent Labs  Lab 06/21/19 1508 06/22/19 0528  WBC 17.1* 17.2*  NEUTROABS 13.8*  --   HGB 14.2 12.4  HCT 41.9 37.0  MCV 91.1 93.0  PLT 243 [id number]   Basic Metabolic Panel: Recent Labs  Lab 06/21/19 1508 06/22/19 0528  NA 138 136  K 3.8 3.8  CL 104 104  CO2 22 23  GLUCOSE 149* 173*  BUN 12 9  CREATININE 0.76 0.67  CALCIUM 9.5 8.4*   GFR: Estimated Creatinine Clearance: 84.2 mL/min (by C-G formula based on SCr of 0.67 mg/dL). Liver Function Tests: Recent Labs  Lab 06/21/19 1508  AST 25  ALT 23  ALKPHOS 86  BILITOT 1.2  PROT 8.1  ALBUMIN 4.5   No results for input(s): LIPASE, AMYLASE in the last 168 hours. No results for input(s): AMMONIA in the last 168 hours. Coagulation Profile: Recent Labs  Lab 06/21/19 1508  INR 1.0   Cardiac Enzymes: No results for input(s): CKTOTAL, CKMB, CKMBINDEX, TROPONINI in the last 168 hours. BNP (last 3 results) No results for input(s): PROBNP in the last 8760 hours. HbA1C: Recent Labs    06/22/19 0556  HGBA1C 6.6*   CBG: Recent Labs  Lab 06/22/19 0925 06/22/19 1310  GLUCAP 121* 133*   Lipid Profile: No results for input(s): CHOL, HDL, LDLCALC, TRIG, CHOLHDL, LDLDIRECT in the last 72 hours. Thyroid Function Tests: Recent Labs    06/22/19 0528  TSH 1.018   Anemia Panel: Recent Labs    06/22/19 0528  FERRITIN 147   Urine analysis:    Component Value Date/Time   COLORURINE YELLOW (A) 06/21/2019 1728    APPEARANCEUR CLEAR (A) 06/21/2019 1728   LABSPEC 1.016 06/21/2019 1728   PHURINE 7.0 06/21/2019 1728   GLUCOSEU NEGATIVE 06/21/2019 1728   HGBUR NEGATIVE 06/21/2019 1728   BILIRUBINUR NEGATIVE 06/21/2019 1728   KETONESUR NEGATIVE 06/21/2019 1728   PROTEINUR NEGATIVE 06/21/2019 1728   NITRITE NEGATIVE 06/21/2019 1728   LEUKOCYTESUR NEGATIVE 06/21/2019 1728   Sepsis Labs: @LABRCNTIP (procalcitonin:4,lacticidven:4)  ) Recent Results (from the past 240 hour(s))  Culture, blood (Routine x 2)     Status: None (Preliminary result)   Collection Time: 06/21/19  3:09 PM   Specimen: BLOOD  Result Value Ref Range Status   Specimen Description BLOOD RIGHT ANTECUBITAL  Final   Special Requests   Final    BOTTLES DRAWN AEROBIC AND ANAEROBIC Blood Culture results may not be optimal due to  an excessive volume of blood received in culture bottles   Culture   Final    NO GROWTH < 24 HOURS Performed at [facility], [address] [place], [name] [address]    Report Status PENDING  Incomplete  Group A Strep by PCR ([facility] Only)     Status: Abnormal   Collection Time: 06/21/19  5:29 PM   Specimen: Throat; Sterile Swab  Result Value Ref Range Status   Group A Strep by PCR DETECTED (A) NOT DETECTED Final    Comment: Performed at [facility], [address] [place], [place] [address]  SARS CORONAVIRUS 2 (TAT 6-24 HRS) Nasopharyngeal Throat     Status: None   Collection Time: 06/21/19  5:29 PM   Specimen: Throat; Nasopharyngeal  Result Value Ref Range Status   SARS Coronavirus 2 NEGATIVE NEGATIVE Final    Comment: (NOTE) SARS-CoV-2 target nucleic acids are NOT DETECTED. The SARS-CoV-2 RNA is generally detectable in upper and lower respiratory specimens during the acute phase of infection. Negative results do not preclude SARS-CoV-2 infection, do not rule out co-infections with other pathogens, and should not be used as the sole basis for treatment or other patient  management decisions. Negative results must be combined with clinical observations, patient history, and epidemiological information. The expected result is Negative. Fact Sheet for Patients: SugarRoll.be Fact Sheet for Healthcare Providers: https://www.woods-mathews.com/ This test is not yet approved or cleared by the [place] FDA and  has been authorized for detection and/or diagnosis of SARS-CoV-2 by FDA under an Emergency Use Authorization (EUA). This EUA will remain  in effect (meaning this test can be used) for the duration of the COVID-19 declaration under Section 56 4(b)(1) of the Act, 21 U.S.C. section 360bbb-3(b)(1), unless the authorization is terminated or revoked sooner. Performed at [facility], [place] [address] [place], [place] [address]       Studies: Dg Chest 2 View  Result Date: 06/21/2019 CLINICAL DATA:  Suspected sepsis EXAM: CHEST - 2 VIEW COMPARISON:  None. FINDINGS: Mild cardiomegaly. Mild, diffuse interstitial pulmonary opacity. The visualized skeletal structures are unremarkable. IMPRESSION: 1. Mild, diffuse bilateral interstitial pulmonary opacity, which may reflect atypical or viral infection as well as edema. No focal airspace opacity. 2.  Cardiomegaly. Electronically Signed   By: Eddie Candle M.D.   On: 06/21/2019 15:45   Ct Head Wo Contrast  Result Date: 06/21/2019 CLINICAL DATA:  Headache, fever EXAM: CT HEAD WITHOUT CONTRAST TECHNIQUE: Contiguous axial images were obtained from the base of the skull through the vertex without intravenous contrast. COMPARISON:  None. FINDINGS: Brain: No acute intracranial abnormality. Specifically, no hemorrhage, hydrocephalus, mass lesion, acute infarction, or significant intracranial injury. Vascular: No hyperdense vessel or unexpected calcification. Skull: No acute calvarial abnormality. Sinuses/Orbits: Mucosal thickening throughout the paranasal sinuses. Air-fluid  level in the left maxillary sinus. Other: None IMPRESSION: No intracranial abnormality. Acute on chronic sinusitis. Electronically Signed   By: Rolm Baptise M.D.   On: 06/21/2019 21:11   US Renal  Result Date: 06/22/2019 CLINICAL DATA:  Microscopic hematuria EXAM: RENAL / URINARY TRACT ULTRASOUND COMPLETE COMPARISON:  CT AP 06/21/2019. FINDINGS: Right Kidney: Renal measurements: 12.3 x 4.5 x 4.2 cm = volume: 122 mL . Echogenicity within normal limits. No mass or hydronephrosis visualized. Left Kidney: Renal measurements: 11.5 x 5.11 x 4.8 cm = volume: 146.3 mL. Echogenicity within normal limits. No mass or hydronephrosis visualized. Bladder: Appears normal for degree of bladder distention. Other: None. IMPRESSION: Normal renal  sonogram. Electronically Signed   By: Kerby Moors M.D.   On: 06/22/2019 07:36   Ct Renal Stone Study  Result Date: 06/21/2019 CLINICAL DATA:  Hematuria, sepsis. Headache, body aches cough. EXAM: CT ABDOMEN AND PELVIS WITHOUT CONTRAST TECHNIQUE: Multidetector CT imaging of the abdomen and pelvis was performed following the standard protocol without IV contrast. COMPARISON:  None. FINDINGS: Lower chest: No sign of consolidation or evidence of pleural effusion. Hepatobiliary: Mild hepatic steatosis. No signs of focal lesion on noncontrast exam. Focal fatty sparing at the gallbladder fossa. Post cholecystectomy. Mild extrahepatic biliary ductal distension may reflect post cholecystectomy baseline. Pancreas: Unremarkable. No pancreatic ductal dilatation or surrounding inflammatory changes. Spleen: Granulomas in the spleen. Adrenals/Urinary Tract: Adrenal glands are normal. Kidneys with normal contour. No signs of hydronephrosis. Stomach/Bowel: Small hiatal hernia. No signs of acute bowel process. The appendix is normal. Vascular/Lymphatic: Scattered atherosclerosis. No signs of aneurysm. No signs of retroperitoneal or upper abdominal lymphadenopathy. No signs of pelvic lymphadenopathy.  Reproductive: Post hysterectomy. Other: No signs of free air. No ascites. Musculoskeletal: No signs of acute bone finding or evidence of destructive bone process. IMPRESSION: 1. No acute findings in the abdomen or pelvis. 2. Post hysterectomy and cholecystectomy. 3. Signs of hepatic steatosis. Electronically Signed   By: Zetta Bills M.D.   On: 06/21/2019 19:28    Scheduled Meds: . chlorpheniramine-HYDROcodone  5 mL Oral Q12H  . enoxaparin (LOVENOX) injection  40 mg Subcutaneous Q24H  . guaiFENesin  600 mg Oral BID  . insulin aspart  0-9 Units Subcutaneous TID WC    Continuous Infusions: . sodium chloride    . azithromycin    . cefTRIAXone (ROCEPHIN)  IV       LOS: 1 day     Kayleen Memos, MD [facility] Pager [phone]  If 7PM-7AM, please contact night-coverage [email or web address] Password [email or web address] 06/22/2019, 3:00 PM

## 2019-06-22 NOTE — Progress Notes (Signed)
Spoke with sister Arbie Cookey on the phone with patient's permission. Patient also gave permission for me to give her sister her password. Arbie Cookey was provided the password and an update on patient's condition with all questions answered.

## 2019-06-22 NOTE — ED Notes (Signed)
Report given to Kate RN

## 2019-06-22 NOTE — ED Notes (Signed)
Report given to Olivia, RN

## 2019-06-22 NOTE — ED Notes (Signed)
Pt's daughter Mickel Baas updated on pt's status

## 2019-06-22 NOTE — ED Notes (Signed)
Pt calling out d/t vomiting- pt vomited on the floor- see MAR for intervention

## 2019-06-23 LAB — GLUCOSE, CAPILLARY
Glucose-Capillary: 116 mg/dL — ABNORMAL HIGH (ref 70–99)
Glucose-Capillary: 115 mg/dL — ABNORMAL HIGH (ref 70–99)
Glucose-Capillary: 166 mg/dL — ABNORMAL HIGH (ref 70–99)
Glucose-Capillary: 167 mg/dL — ABNORMAL HIGH (ref 70–99)

## 2019-06-23 MED ORDER — BENZOCAINE 20 % MT AERO
INHALATION_SPRAY | Freq: Two times a day (BID) | OROMUCOSAL | Status: DC | PRN
  Filled 2019-06-23: qty 57

## 2019-06-23 MED ORDER — DEXAMETHASONE SODIUM PHOSPHATE 10 MG/ML IJ SOLN
10.0000 mg | Freq: Two times a day (BID) | INTRAMUSCULAR | Status: DC
  Administered 2019-06-23 – 2019-06-25 (×5): 10 mg via INTRAVENOUS
  Filled 2019-06-23 (×5): qty 1

## 2019-06-23 NOTE — Progress Notes (Signed)
PROGRESS NOTE  Nancy Campos [id number] DOB: Nov 13, 1960 DOA: 06/21/2019 PCP: Perrin Maltese, MD  HPI/Recap of past 24 hours: Nancy Campos  is a 58 y.o. Caucasian female with a known history of type 2 diabetes mellitus, hypertension dyslipidemia and ongoing tobacco abuse who presented to the emergency room with acute onset of sore throat that started this morning with associated high fever of 100.2, headache and backache as well as nausea without vomiting or diarrhea or abdominal pain.  She admitted to odynophagia as well as cough with inability to expectorate.  She stated that she would have severe headache with her cough.  She admitted to mild rhinorrhea and nasal congestion as well as tinnitus.  She has been feeling significantly weak.  She admitted to dyspnea with her symptoms.  In the ER she had mild dysuria without urinary frequency or urgency or gross hematuria.  She denied any orthopnea or paroxysmal nocturnal dyspnea or lower extremity edema.  Upon presentation to the emergency room, temperature was 103.1 with a blood pressure 148/84, heart rate of 126 and respiratory to 24 with pulse ox of 99% on room air.  Labs revealed blood glucose of 149 with otherwise unremarkable CMP.  BNP was 27.  CBC showed leukocytosis with neutrophilia and ANC/ALC was 13.8/2.1.  Lactic acid was 1.8 and later 1.4 and high-sensitivity troponin I was 5 and later 4.  Influenza AMB antigen came back negative.  COVID-19 test is currently pending.  Urinalysis showed microscopic materia with 15-20 RBCs and no WBCs.  Nitrite was negative.  Her group a strep by PCR came back positive  The patient was given IV Rocephin and Zithromax, 650 mg p.o. Tylenol, 0.5 mg IV Dilaudid and 4 mg of IV morphine sulfate as well as 4 mg of IV Zofran and 1 L bolus of IV normal saline.  She will be admitted to medical monitored bed for further evaluation and management.    06/23/19: Patient was seen and examined at her bedside this morning.   She reports significant odynophagia.  Started benzocaine mouth spray and IV Decadron 10 mg twice daily to help with swelling and inflammation.  She is currently on Rocephin and a azithromycin empirically.  Blood cultures negative to date.  Assessment/Plan: Active Problems:   Streptococcal pharyngitis  1.  Sepsis 2/2 Streptococcal pharyngitis  Presented with fever Tmax 103 and leukocytosis C/ Rocephin and IV azithromycin empirically.   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  Bilateral renal ultrasound unrevealing. O2 saturation 98% on 2 L Obtain CBC in the morning  2.  Suspected community-acquired pneumonia.  This could be streptococcal as well.   Blood cultures negative to date Continue IV antibiotics empirically.   COVID-19 negative.   3.    Resolved severe headache described as the worst headache of her life and worsening with cough.   CT head unremarkable for any acute intracranial findings.   Continue to treat symptomatically as directed  4.  Hypertension.    Uncontrolled.  Reduced rate of normal saline at 75 cc/h from 100 cc/h. Continue to monitor vital signs.  5.  Type II Diabetes mellitus.    Hemoglobin A1c 6.6 on 06/22/2019.  Continue insulin sliding scale  6.  Dyslipidemia.  Not on medication  Acute dehydration in the setting of poor oral intake Continue IV fluid hydration, cut down rate.  DVT prophylaxis.  Subcutaneous Lovenox daily.    Objective: Vitals:   06/22/19 1606 06/22/19 2134 06/23/19 0640 06/23/19 1206  BP: (!) 151/74 134/64 (!) 147/64 (!) 146/70  Pulse: 79 81 79 75  Resp: 16 20 18 20   Temp: 99.7 F (37.6 C) 98.4 F (36.9 C) 99.4  F (37.4 C) 99.2 F (37.3 C)  TempSrc: Oral Oral Oral Oral  SpO2: 98% 100% 96% 98%  Weight:      Height:        Intake/Output Summary (Last 24 hours) at 06/23/2019 1411 Last data filed at 06/23/2019 1146 Gross per 24 hour  Intake 1799.13 ml  Output 1000 ml  Net 799.13 ml   Filed Weights   06/21/19 1507  Weight: 88.5 kg    Exam:   General: 58 y.o. year-old female well developed well nourished in no acute distress.  Alert and oriented times 4.  Cardiovascular: Regular rate and rhythm no rubs or gallops.  Respiratory: Clear to auscultation no wheezes or rales.  Abdomen: Soft nontender nondistended normal bowel sounds present.    \Musculoskeletal: No lower extremity edema.  Peripheral pulses in all 4 extremities.    Psychiatry: Mood is appropriate for condition and setting.   Data Reviewed: CBC: Recent Labs  Lab 06/21/19 1508 06/22/19 0528  WBC 17.1* 17.2*  NEUTROABS 13.8*  --   HGB 14.2 12.4  HCT 41.9 37.0  MCV 91.1 93.0  PLT 243 [id number]   Basic Metabolic Panel: Recent Labs  Lab 06/21/19 1508 06/22/19 0528  NA 138 136  K 3.8 3.8  CL 104 104  CO2 22 23  GLUCOSE 149* 173*  BUN 12 9  CREATININE 0.76 0.67  CALCIUM 9.5 8.4*   GFR: Estimated Creatinine Clearance: 84.2 mL/min (by C-G formula based on SCr of 0.67 mg/dL). Liver Function Tests: Recent Labs  Lab 06/21/19 1508  AST 25  ALT 23  ALKPHOS 86  BILITOT 1.2  PROT 8.1  ALBUMIN 4.5   No results for input(s): LIPASE, AMYLASE in the last 168 hours. No results for input(s): AMMONIA in the last 168 hours. Coagulation Profile: Recent Labs  Lab 06/21/19 1508  INR 1.0   Cardiac Enzymes: No results for input(s): CKTOTAL, CKMB, CKMBINDEX, TROPONINI in the last 168 hours. BNP (last 3 results) No results for input(s): PROBNP in the last 8760 hours. HbA1C: Recent Labs    06/22/19 0556  HGBA1C 6.6*   CBG: Recent Labs  Lab 06/22/19 1310 06/22/19 1625 06/22/19 2141 06/23/19 0746 06/23/19 1203    GLUCAP 133* 121* 132* 116* 115*   Lipid Profile: No results for input(s): CHOL, HDL, LDLCALC, TRIG, CHOLHDL, LDLDIRECT in the last 72 hours. Thyroid Function Tests: Recent Labs    06/22/19 0528  TSH 1.018   Anemia Panel: Recent Labs    06/22/19 0528  FERRITIN 147   Urine analysis:    Component Value Date/Time   COLORURINE YELLOW (A) 06/21/2019 1728   APPEARANCEUR CLEAR (A) 06/21/2019 1728   LABSPEC 1.016 06/21/2019 1728   PHURINE 7.0 06/21/2019 1728   GLUCOSEU NEGATIVE 06/21/2019 1728   HGBUR NEGATIVE 06/21/2019 1728   BILIRUBINUR NEGATIVE 06/21/2019 1728   KETONESUR NEGATIVE 06/21/2019 1728   PROTEINUR NEGATIVE 06/21/2019 1728   NITRITE NEGATIVE 06/21/2019 1728   LEUKOCYTESUR NEGATIVE 06/21/2019 1728   Sepsis Labs: @LABRCNTIP (procalcitonin:4,lacticidven:4)  ) Recent Results (from the past 240 hour(s))  Culture,  blood (Routine x 2)     Status: None (Preliminary result)   Collection Time: 06/21/19  3:09 PM   Specimen: BLOOD  Result Value Ref Range Status   Specimen Description BLOOD RIGHT ANTECUBITAL  Final   Special Requests   Final    BOTTLES DRAWN AEROBIC AND ANAEROBIC Blood Culture results may not be optimal due to an excessive volume of blood received in culture bottles   Culture   Final    NO GROWTH < 24 HOURS Performed at [facility], [address], [place], [place] [address]    Report Status PENDING  Incomplete  Group A Strep by PCR ([facility] Only)     Status: Abnormal   Collection Time: 06/21/19  5:29 PM   Specimen: Throat; Sterile Swab  Result Value Ref Range Status   Group A Strep by PCR DETECTED (A) NOT DETECTED Final    Comment: Performed at [facility], [address] [place], [place] [address]  SARS CORONAVIRUS 2 (TAT 6-24 HRS) Nasopharyngeal Throat     Status: None   Collection Time: 06/21/19  5:29 PM   Specimen: Throat; Nasopharyngeal  Result Value Ref Range Status   SARS Coronavirus 2 NEGATIVE NEGATIVE Final    Comment:  (NOTE) SARS-CoV-2 target nucleic acids are NOT DETECTED. The SARS-CoV-2 RNA is generally detectable in upper and lower respiratory specimens during the acute phase of infection. Negative results do not preclude SARS-CoV-2 infection, do not rule out co-infections with other pathogens, and should not be used as the sole basis for treatment or other patient management decisions. Negative results must be combined with clinical observations, patient history, and epidemiological information. The expected result is Negative. Fact Sheet for Patients: SugarRoll.be Fact Sheet for Healthcare Providers: https://www.woods-mathews.com/ This test is not yet approved or cleared by the [place] FDA and  has been authorized for detection and/or diagnosis of SARS-CoV-2 by FDA under an Emergency Use Authorization (EUA). This EUA will remain  in effect (meaning this test can be used) for the duration of the COVID-19 declaration under Section 56 4(b)(1) of the Act, 21 U.S.C. section 360bbb-3(b)(1), unless the authorization is terminated or revoked sooner. Performed at [facility], [place] [address], [place], [place] [address]       Studies: No results found.  Scheduled Meds:  chlorpheniramine-HYDROcodone  5 mL Oral Q12H   dexamethasone (DECADRON) injection  10 mg Intravenous Q12H   enoxaparin (LOVENOX) injection  40 mg Subcutaneous Q24H   guaiFENesin  600 mg Oral BID   insulin aspart  0-9 Units Subcutaneous TID WC    Continuous Infusions:  sodium chloride 100 mL/hr at 06/23/19 1146   azithromycin Stopped (06/22/19 2023)   cefTRIAXone (ROCEPHIN)  IV Stopped (06/22/19 1854)     LOS: 2 days     Kayleen Memos, MD [facility] Pager [phone]  If 7PM-7AM, please contact night-coverage [email or web address] Password [email or web address] 06/23/2019, 2:11 PM

## 2019-06-24 LAB — BASIC METABOLIC PANEL
Anion gap: 7 (ref 5–15)
BUN: 12 mg/dL (ref 6–20)
CO2: 26 mmol/L (ref 22–32)
Calcium: 8.7 mg/dL — ABNORMAL LOW (ref 8.9–10.3)
Chloride: 109 mmol/L (ref 98–111)
Creatinine, Ser: 0.45 mg/dL (ref 0.44–1.00)
GFR calc Af Amer: >60 mL/min (ref >60)
GFR calc non Af Amer: >60 mL/min (ref >60)
Glucose, Bld: 176 mg/dL — ABNORMAL HIGH (ref 70–99)
Potassium: 4 mmol/L (ref 3.5–5.1)
Sodium: 142 mmol/L (ref 135–145)

## 2019-06-24 LAB — CBC WITH DIFFERENTIAL/PLATELET
Abs Immature Granulocytes: 0.17 10*3/uL — ABNORMAL HIGH (ref 0.00–0.07)
Basophils Absolute: 0 10*3/uL (ref 0.0–0.1)
Basophils Relative: 0 %
Eosinophils Absolute: 0 10*3/uL (ref 0.0–0.5)
Eosinophils Relative: 0 %
HCT: 36.3 % (ref 36.0–46.0)
Hemoglobin: 11.9 g/dL — ABNORMAL LOW (ref 12.0–15.0)
Immature Granulocytes: 1 %
Lymphocytes Relative: 8 %
Lymphs Abs: 1.1 10*3/uL (ref 0.7–4.0)
MCH: 30.7 pg (ref 26.0–34.0)
MCHC: 32.8 g/dL (ref 30.0–36.0)
MCV: 93.6 fL (ref 80.0–100.0)
Monocytes Absolute: 0.4 10*3/uL (ref 0.1–1.0)
Monocytes Relative: 3 %
Neutro Abs: 12.4 10*3/uL — ABNORMAL HIGH (ref 1.7–7.7)
Neutrophils Relative %: 88 %
Platelets: 196 10*3/uL (ref 150–400)
RBC: 3.88 MIL/uL (ref 3.87–5.11)
RDW: 12 % (ref 11.5–15.5)
WBC: 14.1 10*3/uL — ABNORMAL HIGH (ref 4.0–10.5)
nRBC: 0 % (ref 0.0–0.2)

## 2019-06-24 LAB — GLUCOSE, CAPILLARY
Glucose-Capillary: 160 mg/dL — ABNORMAL HIGH (ref 70–99)
Glucose-Capillary: 185 mg/dL — ABNORMAL HIGH (ref 70–99)
Glucose-Capillary: 208 mg/dL — ABNORMAL HIGH (ref 70–99)
Glucose-Capillary: 162 mg/dL — ABNORMAL HIGH (ref 70–99)

## 2019-06-24 LAB — MAGNESIUM: Magnesium: 2.4 mg/dL (ref 1.7–2.4)

## 2019-06-24 MED ORDER — HYDROCOD POLST-CPM POLST ER 10-8 MG/5ML PO SUER: 5.0000 mL | Freq: Four times a day (QID) | ORAL | Status: DC

## 2019-06-24 MED ORDER — PHENOL 1.4 % MT LIQD
1.0000 | OROMUCOSAL | Status: DC | PRN
  Administered 2019-06-24: 1 via OROMUCOSAL
  Filled 2019-06-24: qty 177

## 2019-06-24 MED ORDER — MENTHOL 3 MG MT LOZG
1.0000 | LOZENGE | OROMUCOSAL | Status: DC | PRN
  Administered 2019-06-24: 22:00:00 3 mg via ORAL
  Filled 2019-06-24: qty 9

## 2019-06-24 MED ORDER — HYDROCODONE-HOMATROPINE 5-1.5 MG/5ML PO SYRP
5.0000 mL | ORAL_SOLUTION | Freq: Four times a day (QID) | ORAL | Status: DC
  Administered 2019-06-24 – 2019-06-25 (×4): 5 mL via ORAL
  Filled 2019-06-24 (×5): qty 5

## 2019-06-24 NOTE — Progress Notes (Signed)
PROGRESS NOTE  Nancy Campos [id number] DOB: May 08, 1961 DOA: 06/21/2019 PCP: Perrin Maltese, MD  HPI/Recap of past 24 hours: Nancy Campos  is a 58 y.o. Caucasian female with a known history of type 2 diabetes mellitus, hypertension dyslipidemia and ongoing tobacco abuse who presented to the emergency room with acute onset of sore throat that started this morning with associated high fever of 100.2, headache and backache as well as nausea without vomiting or diarrhea or abdominal pain.  She admitted to odynophagia as well as cough with inability to expectorate.  She stated that she would have severe headache with her cough.  She admitted to mild rhinorrhea and nasal congestion as well as tinnitus.  She has been feeling significantly weak.  She admitted to dyspnea with her symptoms.  In the ER she had mild dysuria without urinary frequency or urgency or gross hematuria.  She denied any orthopnea or paroxysmal nocturnal dyspnea or lower extremity edema.  Upon presentation to the emergency room, temperature was 103.1 with a blood pressure 148/84, heart rate of 126 and respiratory to 24 with pulse ox of 99% on room air.  Labs revealed blood glucose of 149 with otherwise unremarkable CMP.  BNP was 27.  CBC showed leukocytosis with neutrophilia and ANC/ALC was 13.8/2.1.  Lactic acid was 1.8 and later 1.4 and high-sensitivity troponin I was 5 and later 4.  Influenza AMB antigen came back negative.  COVID-19 test is currently pending.  Urinalysis showed microscopic materia with 15-20 RBCs and no WBCs.  Nitrite was negative.  Her group a strep by PCR came back positive  The patient was given IV Rocephin and Zithromax, 650 mg p.o. Tylenol, 0.5 mg IV Dilaudid and 4 mg of IV morphine sulfate as well as 4 mg of IV Zofran and 1 L bolus of IV normal saline.  She will be admitted to medical monitored bed for further evaluation and management.    06/23/19: Patient was seen and examined at her bedside this morning.   She reports significant odynophagia.  Started benzocaine mouth spray and IV Decadron 10 mg twice daily to help with swelling and inflammation.  She is currently on Rocephin and a azithromycin empirically.  Blood cultures negative to date.  06/24/19: Patient was seen and examined at her bedside this morning.  Reports significant pain with swallowing.  Started Hycodan every 6 hours.  Minimal oral intake.  Hypovolemic on exam, will continue IV fluid hydration.  Assessment/Plan: Active Problems:   Streptococcal pharyngitis  Sepsis 2/2 Streptococcal pharyngitis  Presented with fever Tmax 103 and leukocytosis with WBC 17 K Leukocytosis persistent, wbc 14K Continue IV Rocephin and IV azithromycin empirically.   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  Bilateral renal ultrasound unrevealing. O2 saturation 98% on 2 L Obtain CBC in the morning  Acute dehydration in the setting of poor oral intake Continue IV fluid hydration due to poor oral intake and hypovolemic appearance on exam.  Suspected community-acquired pneumonia.  This could be streptococcal as well.   Blood cultures negative to date Continue IV antibiotics empirically.   COVID-19 negative.  Patient requests repeat COVID-19 on 06/24/2019 due  to concern for possible infection.  Resolved severe headache described as the worst headache of her life and worsening with cough.   CT head unremarkable for any acute intracranial findings.   Continue to treat symptomatically as directed  Transient hypertension.    No history of hypertension.  C/ to monitor  vital signs.  Type II Diabetes mellitus.    Hemoglobin A1c 6.6 on 06/22/2019.  Continue insulin sliding scale  Dyslipidemia.    Not on medication    DVT prophylaxis.  Subcutaneous Lovenox daily.  Disposition: Patient is currently not appropriate for discharge due to poor oral intake 2/2 to odynophagia requiring IV fluid. Ongoing sepsis requiring IV antibiotics.     Objective: Vitals:   06/23/19 1206 06/23/19 1936 06/24/19 0644 06/24/19 1159  BP: (!) 146/70 (!) 156/76 (!) 153/68 (!) 122/59  Pulse: 75 72 (!) 56 71  Resp: 20 20  20   Temp: 99.2 F (37.3 C) 98.6 F (37 C) 98.1 F (36.7 C) 98.2 F (36.8 C)  TempSrc: Oral Oral Oral Oral  SpO2: 98% 98% 98% 98%  Weight:      Height:        Intake/Output Summary (Last 24 hours) at 06/24/2019 1556 Last data filed at 06/24/2019 0341 Gross per 24 hour  Intake 1346.32 ml  Output 300 ml  Net 1046.32 ml   Filed Weights   06/21/19 1507  Weight: 88.5 kg    Exam:  . General: 58 y.o. year-old female Well developed well nourished. Appears uncomfortable due to odynophagia. A&O x 4. Dry mucous membranes. . Cardiovascular: RRR no rubs or gallops. Marland Kitchen   Respiratory: Mild rales at bases. No wheezes. Poor inspiratory efforts. . Abdomen: Soft NT ND NBS. Marland Kitchen Musculoskeletal: No LE edema 2/4 pulses in all 4. . Psychiatry: Mood is appropriate for condition and setting..   Data Reviewed: CBC: Recent Labs  Lab 06/21/19 1508 06/22/19 0528 06/24/19 0612  WBC 17.1* 17.2* 14.1*  NEUTROABS 13.8*  --  12.4*  HGB 14.2 12.4 11.9*  HCT 41.9 37.0 36.3  MCV 91.1 93.0 93.6  PLT 243 174 [id number]   Basic Metabolic Panel: Recent Labs  Lab 06/21/19 1508 06/22/19 0528 06/24/19 0612  NA 138 136 142  K 3.8 3.8 4.0  CL 104 104 109  CO2 22 23 26   GLUCOSE 149* 173* 176*  BUN 12 9 12   CREATININE 0.76 0.67 0.45  CALCIUM 9.5 8.4* 8.7*  MG  --   --  2.4   GFR: Estimated Creatinine Clearance: 84.2 mL/min (by C-G formula based on SCr of 0.45 mg/dL).  Liver Function Tests: Recent Labs  Lab 06/21/19 1508  AST 25  ALT 23  ALKPHOS 86  BILITOT 1.2  PROT 8.1  ALBUMIN 4.5   No results for input(s): LIPASE, AMYLASE in the last 168 hours. No results for input(s): AMMONIA in the last 168 hours. Coagulation Profile: Recent Labs  Lab 06/21/19 1508  INR 1.0   Cardiac Enzymes: No results for input(s): CKTOTAL, CKMB, CKMBINDEX, TROPONINI in the last 168 hours. BNP (last 3 results) No results for input(s): PROBNP in the last 8760 hours. HbA1C: Recent Labs    06/22/19 0556  HGBA1C 6.6*   CBG: Recent Labs  Lab 06/23/19 1203 06/23/19 1711 06/23/19 2122 06/24/19 0740 06/24/19 1156  GLUCAP 115* 166* 167* 160* 185*   Lipid Profile: No results for input(s): CHOL, HDL, LDLCALC, TRIG, CHOLHDL, LDLDIRECT in the last 72 hours. Thyroid Function Tests: Recent Labs    06/22/19 0528  TSH  1.018   Anemia Panel: Recent Labs    06/22/19 0528  FERRITIN 147   Urine analysis:    Component Value Date/Time   COLORURINE YELLOW (A) 06/21/2019 1728   APPEARANCEUR CLEAR (A) 06/21/2019 1728   LABSPEC 1.016 06/21/2019 1728   PHURINE 7.0 06/21/2019 1728   GLUCOSEU NEGATIVE 06/21/2019 1728   HGBUR NEGATIVE 06/21/2019 1728   BILIRUBINUR NEGATIVE 06/21/2019 1728   KETONESUR NEGATIVE 06/21/2019 1728   PROTEINUR NEGATIVE 06/21/2019 1728   NITRITE NEGATIVE 06/21/2019 1728   LEUKOCYTESUR NEGATIVE 06/21/2019 1728   Sepsis Labs: @LABRCNTIP (procalcitonin:4,lacticidven:4)  ) Recent Results (from the past 240 hour(s))  Culture, blood (Routine x 2)     Status: None (Preliminary result)   Collection Time: 06/21/19  3:09 PM   Specimen: BLOOD  Result Value Ref Range Status   Specimen Description BLOOD RIGHT ANTECUBITAL  Final   Special Requests   Final    BOTTLES DRAWN AEROBIC AND ANAEROBIC Blood Culture results may not be optimal due to an excessive volume of blood received in culture bottles   Culture   Final    NO GROWTH 3 DAYS Performed  at [facility], [address], [place], [place] [address]    Report Status PENDING  Incomplete  Group A Strep by PCR ([place] Only)     Status: Abnormal   Collection Time: 06/21/19  5:29 PM   Specimen: Throat; Sterile Swab  Result Value Ref Range Status   Group A Strep by PCR DETECTED (A) NOT DETECTED Final    Comment: Performed at [facility], [address] [place], [place] [address]  SARS CORONAVIRUS 2 (TAT 6-24 HRS) Nasopharyngeal Throat     Status: None   Collection Time: 06/21/19  5:29 PM   Specimen: Throat; Nasopharyngeal  Result Value Ref Range Status   SARS Coronavirus 2 NEGATIVE NEGATIVE Final    Comment: (NOTE) SARS-CoV-2 target nucleic acids are NOT DETECTED. The SARS-CoV-2 RNA is generally detectable in upper and lower respiratory specimens during the acute phase of infection. Negative results do not preclude SARS-CoV-2 infection, do not rule out co-infections with other pathogens, and should not be used as the sole basis for treatment or other patient management decisions. Negative results must be combined with clinical observations, patient history, and epidemiological information. The expected result is Negative. Fact Sheet for Patients: SugarRoll.be Fact Sheet for Healthcare Providers: https://www.woods-mathews.com/ This test is not yet approved or cleared by the [place] FDA and  has been authorized for detection and/or diagnosis of SARS-CoV-2 by FDA under an Emergency Use Authorization (EUA). This EUA will remain  in effect (meaning this test can be used) for the duration of the COVID-19 declaration under Section 56 4(b)(1) of the Act, 21 U.S.C. section 360bbb-3(b)(1), unless the authorization is terminated or revoked sooner. Performed at [facility], [place] [address], [place], [place] [address]       Studies: No results found.  Scheduled Meds: . dexamethasone (DECADRON) injection   10 mg Intravenous Q12H  . enoxaparin (LOVENOX) injection  40 mg Subcutaneous Q24H  . guaiFENesin  600 mg Oral BID  . HYDROcodone-homatropine  5 mL Oral Q6H  . insulin aspart  0-9 Units Subcutaneous TID WC    Continuous Infusions: . sodium chloride 75 mL/hr at 06/24/19 0623  . azithromycin Stopped (06/23/19 1955)  . cefTRIAXone (ROCEPHIN)  IV Stopped (06/23/19 1800)     LOS: 3 days     Kayleen Memos, MD [facility] Pager [phone]  If 7PM-7AM, please contact night-coverage [email or web address] Password [facility] 06/24/2019, 3:56 PM

## 2019-06-25 LAB — CBC
HCT: 32.5 % — ABNORMAL LOW (ref 36.0–46.0)
Hemoglobin: 11.2 g/dL — ABNORMAL LOW (ref 12.0–15.0)
MCH: 31 pg (ref 26.0–34.0)
MCHC: 34.5 g/dL (ref 30.0–36.0)
MCV: 90 fL (ref 80.0–100.0)
Platelets: 211 10*3/uL (ref 150–400)
RBC: 3.61 MIL/uL — ABNORMAL LOW (ref 3.87–5.11)
RDW: 11.9 % (ref 11.5–15.5)
WBC: 13.2 10*3/uL — ABNORMAL HIGH (ref 4.0–10.5)
nRBC: 0 % (ref 0.0–0.2)

## 2019-06-25 LAB — GLUCOSE, CAPILLARY
Glucose-Capillary: 149 mg/dL — ABNORMAL HIGH (ref 70–99)
Glucose-Capillary: 185 mg/dL — ABNORMAL HIGH (ref 70–99)
Glucose-Capillary: 148 mg/dL — ABNORMAL HIGH (ref 70–99)

## 2019-06-25 LAB — MYCOPLASMA PNEUMONIAE ANTIBODY, IGM: Mycoplasma pneumo IgM: <770 U/mL (ref 0–769)

## 2019-06-25 LAB — LEGIONELLA PNEUMOPHILA SEROGP 1 UR AG: L. pneumophila Serogp 1 Ur Ag: NEGATIVE

## 2019-06-25 LAB — SARS CORONAVIRUS 2 (TAT 6-24 HRS): SARS Coronavirus 2: NEGATIVE

## 2019-06-25 LAB — PROCALCITONIN: Procalcitonin: <0.1 ng/mL

## 2019-06-25 MED ORDER — CEFDINIR 300 MG PO CAPS
300.0000 mg | ORAL_CAPSULE | Freq: Two times a day (BID) | ORAL | Status: DC
  Administered 2019-06-25: 11:00:00 300 mg via ORAL
  Filled 2019-06-25 (×2): qty 1

## 2019-06-25 MED ORDER — PREDNISONE 5 MG (21) PO TBPK: 5.0000 mg | ORAL_TABLET | Freq: Four times a day (QID) | ORAL | Status: DC

## 2019-06-25 MED ORDER — HYDROCODONE-HOMATROPINE 5-1.5 MG/5ML PO SYRP: 5.0000 mL | ORAL_SOLUTION | Freq: Two times a day (BID) | ORAL | 0 refills | Status: DC | PRN

## 2019-06-25 MED ORDER — CEFDINIR 300 MG PO CAPS: 300.0000 mg | ORAL_CAPSULE | Freq: Two times a day (BID) | ORAL | 0 refills | Status: AC

## 2019-06-25 MED ORDER — METFORMIN HCL 500 MG PO TABS: 500.0000 mg | ORAL_TABLET | Freq: Two times a day (BID) | ORAL | 0 refills | Status: AC

## 2019-06-25 MED ORDER — PREDNISONE 5 MG (21) PO TBPK: ORAL_TABLET | ORAL | 0 refills | Status: DC

## 2019-06-25 NOTE — Progress Notes (Signed)
Discharge instructions reviewed with patient including followup visits and new medications.  Understanding was verbalized and all questions were answered.  IV removed without complication; patient tolerated well.  Patient discharged home via wheelchair in stable condition escorted by nursing staff.  

## 2019-06-25 NOTE — Discharge Instructions (Addendum)
Pharyngitis  Pharyngitis is a sore throat (pharynx). This is when there is redness, pain, and swelling in your throat. Most of the time, this condition gets better on its own. In some cases, you may need medicine. Follow these instructions at home:  Take over-the-counter and prescription medicines only as told by your doctor. ? If you were prescribed an antibiotic medicine, take it as told by your doctor. Do not stop taking the antibiotic even if you start to feel better. ? Do not give children aspirin. Aspirin has been linked to Reye syndrome.  Drink enough water and fluids to keep your pee (urine) clear or pale yellow.  Get a lot of rest.  Rinse your mouth (gargle) with a salt-water mixture 3-4 times a day or as needed. To make a salt-water mixture, completely dissolve -1 tsp of salt in 1 cup of warm water.  If your doctor approves, you may use throat lozenges or sprays to soothe your throat. Contact a doctor if:  You have large, tender lumps in your neck.  You have a rash.  You cough up green, yellow-brown, or bloody spit. Get help right away if:  You have a stiff neck.  You drool or cannot swallow liquids.  You cannot drink or take medicines without throwing up.  You have very bad pain that does not go away with medicine.  You have problems breathing, and it is not from a stuffy nose.  You have new pain and swelling in your knees, ankles, wrists, or elbows. Summary  Pharyngitis is a sore throat (pharynx). This is when there is redness, pain, and swelling in your throat.  If you were prescribed an antibiotic medicine, take it as told by your doctor. Do not stop taking the antibiotic even if you start to feel better.  Most of the time, pharyngitis gets better on its own. Sometimes, you may need medicine. This information is not intended to replace advice given to you by your health care provider. Make sure you discuss any questions you have with your health care  provider. Document Released: 01/11/2008 Document Revised: 07/07/2017 Document Reviewed: 08/30/2016 Elsevier Patient Education  [employer].   Strep Throat, Adult Strep throat is an infection of the throat. It is caused by germs (bacteria). Strep throat is common during the cold months of the year. It mostly affects children who are 55-71 years old. However, people of all ages can get it at any time of the year. When strep throat affects the tonsils, it is called tonsillitis. When it affects the back of the throat, it is called pharyngitis. This infection spreads from person to person through coughing, sneezing, or having close contact. What are the causes? This condition is caused by the Streptococcus pyogenes germ. What increases the risk? You are more likely to develop this condition if:  You care for young children. Children are more likely to get strep throat and may spread it to others.  You go to crowded places. Germs can spread easily in such places.  You kiss or touch someone who has strep throat. What are the signs or symptoms? Symptoms of this condition include:  Fever or chills.  Redness, swelling, or pain in the tonsils or throat.  Pain or trouble when swallowing.  White or yellow spots on the tonsils or throat.  Tender glands in the neck and under the jaw.  Bad breath.  Red rash all over the body. This is rare. How is this treated? This condition may  be treated with:  Medicines that kill germs (antibiotics).  Medicines that treat pain or fever. These include: ? Ibuprofen or acetaminophen. ? Aspirin, only for patients who are over the age of 63. ? Throat lozenges. ? Throat sprays. Follow these instructions at home: Medicines   Take over-the-counter and prescription medicines only as told by your doctor.  Take your antibiotic medicine as told by your doctor. Do not stop taking the antibiotic even if you start to feel better. Eating and  drinking   If you have trouble swallowing, eat soft foods until your throat feels better.  Drink enough fluid to keep your pee (urine) pale yellow.  To help with pain, you may have: ? Warm fluids, such as soup and tea. ? Cold fluids, such as frozen desserts or popsicles. General instructions  Rinse your mouth (gargle) with a salt-water mixture 3-4 times a day or as needed. To make a salt-water mixture, dissolve -1 tsp (3-6 g) of salt in 1 cup (237 mL) of warm water.  Rest as much as you can.  Stay home from work or school until you have been taking antibiotics for 24 hours.  Avoid smoking or being around people who smoke.  Keep all follow-up visits as told by your doctor. This is important. How is this prevented?   Do not share food, drinking cups, or personal items. They can cause the germs to spread.  Wash your hands well with soap and water. Make sure that all people in your house wash their hands well.  Have family members tested if they have a fever or a sore throat. They may need an antibiotic if they have strep throat. Contact a doctor if:  You have swelling in your neck that keeps getting bigger.  You get a rash, cough, or earache.  You cough up a thick fluid that is green, yellow-brown, or bloody.  You have pain that does not get better with medicine.  Your symptoms get worse instead of getting better.  You have a fever. Get help right away if:  You vomit.  You have a very bad headache.  Your neck hurts or feels stiff.  You have chest pain or are short of breath.  You have drooling, very bad throat pain, or changes in your voice.  Your neck is swollen, or the skin gets red and tender.  Your mouth is dry, or you are peeing less than normal.  You keep feeling more tired or have trouble waking up.  Your joints are red or painful. Summary  Strep throat is an infection of the throat. It is caused by germs (bacteria).  This infection can spread  from person to person through coughing, sneezing, or having close contact.  Take your medicines, including antibiotics, as told by your doctor. Do not stop taking the antibiotic even if you start to feel better.  To prevent the spread of germs, wash your hands well with soap and water. Have others do the same. Do not share food, drinking cups, or personal items.  Get help right away if you have a bad headache, chest pain, shortness of breath, a stiff or painful neck, or you vomit. This information is not intended to replace advice given to you by your health care provider. Make sure you discuss any questions you have with your health care provider. Document Released: 01/11/2008 Document Revised: 10/12/2018 Document Reviewed: 10/12/2018 Elsevier Patient Education  [place].   Upper Respiratory Infection, Adult An upper respiratory infection (  URI) affects the nose, throat, and upper air passages. URIs are caused by germs (viruses). The most common type of URI is often called "the common cold." Medicines cannot cure URIs, but you can do things at home to relieve your symptoms. URIs usually get better within 7-10 days. Follow these instructions at home: Activity  Rest as needed.  If you have a fever, stay home from work or school until your fever is gone, or until your doctor says you may return to work or school. ? You should stay home until you cannot spread the infection anymore (you are not contagious). ? Your doctor may have you wear a face mask so you have less risk of spreading the infection. Relieving symptoms  Gargle with a salt-water mixture 3-4 times a day or as needed. To make a salt-water mixture, completely dissolve -1 tsp of salt in 1 cup of warm water.  Use a cool-mist humidifier to add moisture to the air. This can help you breathe more easily. Eating and drinking   Drink enough fluid to keep your pee (urine) pale yellow.  Eat soups and other clear  broths. General instructions   Take over-the-counter and prescription medicines only as told by your doctor. These include cold medicines, fever reducers, and cough suppressants.  Do not use any products that contain nicotine or tobacco. These include cigarettes and e-cigarettes. If you need help quitting, ask your doctor.  Avoid being where people are smoking (avoid secondhand smoke).  Make sure you get regular shots and get the flu shot every year.  Keep all follow-up visits as told by your doctor. This is important. How to avoid spreading infection to others   Wash your hands often with soap and water. If you do not have soap and water, use hand sanitizer.  Avoid touching your mouth, face, eyes, or nose.  Cough or sneeze into a tissue or your sleeve or elbow. Do not cough or sneeze into your hand or into the air. Contact a doctor if:  You are getting worse, not better.  You have any of these: ? A fever. ? Chills. ? Brown or red mucus in your nose. ? Yellow or brown fluid (discharge)coming from your nose. ? Pain in your face, especially when you bend forward. ? Swollen neck glands. ? Pain with swallowing. ? White areas in the back of your throat. Get help right away if:  You have shortness of breath that gets worse.  You have very bad or constant: ? Headache. ? Ear pain. ? Pain in your forehead, behind your eyes, and over your cheekbones (sinus pain). ? Chest pain.  You have long-lasting (chronic) lung disease along with any of these: ? Wheezing. ? Long-lasting cough. ? Coughing up blood. ? A change in your usual mucus.  You have a stiff neck.  You have changes in your: ? Vision. ? Hearing. ? Thinking. ? Mood. Summary  An upper respiratory infection (URI) is caused by a germ called a virus. The most common type of URI is often called "the common cold."  URIs usually get better within 7-10 days.  Take over-the-counter and prescription medicines only as  told by your doctor. This information is not intended to replace advice given to you by your health care provider. Make sure you discuss any questions you have with your health care provider. Document Released: 01/11/2008 Document Revised: 08/02/2018 Document Reviewed: 03/17/2017 Elsevier Patient Education  2020 [employer].

## 2019-06-25 NOTE — Discharge Summary (Signed)
Discharge Summary  Nancy Campos [id number] DOB: 1961-01-27  PCP: Perrin Maltese, MD  Admit date: 06/21/2019 Discharge date: 06/25/2019  Time spent: 35 minutes   Recommendations for Outpatient Follow-up:  1. Follow up with your PCP 2. Take your medications as prescribed  Discharge Diagnoses:  Active Hospital Problems   Diagnosis Date Noted   Streptococcal pharyngitis 06/21/2019    Resolved Hospital Problems  No resolved problems to display.    Discharge Condition: Stable  Diet recommendation: Resume previous diet   Vitals:   06/25/19 0921 06/25/19 1005  BP:    Pulse:    Resp:    Temp:    SpO2: 96% 93%    History of present illness:   Nancy Campos a58 y.o.Caucasian femalewith a known history of type 2 diabetes mellitus, hypertension dyslipidemiaand ongoing tobacco abuse whopresented to the emergency room with acute onset of sore throatthat started this morningwith associated high fever of 100.2, [name]  Upon presentation to the emergency room, temperature was 103.1 with a blood pressure 148/84, heart rate of 126 and respiratory to 24 with pulse ox of 99% on room air. Labs revealed blood glucose of 149 with otherwise unremarkable CMP. BNP was 27. CBC showed leukocytosis with neutrophilia and ANC/ALC was 13.8/2.1. Lactic acid was 1.8 and later 1.4 and high-sensitivity troponin I was 5 and later 4. Influenza AMB  antigen came back negative. COVID-19 test negative. Urinalysis showed microscopic materia with 15-20 RBCs and no WBCs. Nitrite was negative. Her group a strep by PCR came back positive.  The patient was given IV Rocephin and Zithromax, 650 mg p.o. Tylenol, 0.5 mg IV Dilaudid and 4 mg of IV morphine sulfate as well as 4 mg of IV Zofran and 1 L bolus of IV normal saline. She was admitted to medical monitored bed for further evaluation and management.    Completed 4 days of IV azithromycin and IV rocephin with improvement of symptoms. Also received IV decadron 10 mg BID x 4 days. Switched to Cefdinir 300 mg BID x 10 days. Started prednisone taper on 06/25/19.  06/25/19: Patient was seen and examined at her bedside this morning.  No acute events overnight.  She states her odynophagia has resolved.  She is able to tolerate a soft diet.  No new complaints.  Vital signs and labs reviewed and are stable.  On the day of discharge, the patient was hemodynamically stable.  She will need to follow-up with her primary care provider and take her medications as prescribed.  Hospital Course:  Active Problems:   Streptococcal pharyngitis  Resolving sepsis 2/2 Streptococcal pharyngitis  Presented with fever Tmax 103 and leukocytosis with WBC 17 K Leukocytosis trending down, wbc 14K >> 13K Completed 4 days of IV Rocephin and IV azithromycin empirically.  Blood cultures x2 no growth x3 days, to date Continue Hycodan twice daily as needed for cough Received IV Decadron 10 mg twice daily x2 days Received IV Protonix 40 mg daily for GI prophylaxis while on high-dose  steroids  Switched antibiotics to cefdinir 300 mg BID x 10 days and add prednisone taper x 10 doses Follow up with your PCP  Resolved dehydration in the setting of poor oral intake Received IV fluid hydration due to poor oral intake and hypovolemic appearance on exam. At the time of this visit tolerating a diet well.  Ruled out  community-acquired pneumonia.  Blood cultures negative to date Procalciktonin negative less than 0.010 COVID-19 negative on 06/21/19 . Repeat covid 19 negative on 06/24/19  Resolved severe headache described as the worst headache of her life and worsening with cough. CT head unremarkable for any acute intracranial findings.   Continue to treat symptomatically as directed  Transient hypertension.  No history of hypertension. Follow up with PCP.   Obesity Recommend weight loss outpatient with regular physical activity and healthy dieting.  Type II Diabetes mellitus.  Hemoglobin A1c 6.6 on 06/22/2019. Received insulin sliding scale in the hospital. Start metformin 500 mg BID. Follow up with your PCP.  Dyslipidemia.  Not on medication. Follow up with your PCP.   Discharge Exam: BP (!) 158/74 (BP Location: Left Arm)    Pulse (!) 56    Temp 98.1 F (36.7 C) (Oral)    Resp 20    Ht 5\' 5"  (1.651 m)    Wt 88.5 kg    SpO2 93%    BMI 32.45 kg/m   General: 58 y.o. year-old female well developed well nourished in no acute distress.  Alert and oriented x4.  Cardiovascular: Regular rate and rhythm with no rubs or gallops.  No thyromegaly or JVD noted.    Respiratory: Clear to auscultation with no wheezes or rales. Good inspiratory effort.  Abdomen: Soft nontender nondistended with normal bowel sounds x4 quadrants.  Musculoskeletal: No lower extremity edema. 2/4 pulses in all 4 extremities.  Psychiatry: Mood is appropriate for condition and setting  Discharge Instructions You were cared for by a hospitalist during your hospital stay. If you have any questions about your discharge medications or the care you received while you were in the hospital after you are discharged, you can call the unit and asked to speak with the hospitalist on call if the hospitalist that took care of you is not available. Once you are discharged, your primary care physician will handle any further medical  issues. Please note that NO REFILLS for any discharge medications will be authorized once you are discharged, as it is imperative that you return to your primary care physician (or establish a relationship with a primary care physician if you do not have one) for your aftercare needs so that they can reassess your need for medications and monitor your lab values.   Allergies as of 06/25/2019      Reactions   Penicillins    Sulfa Antibiotics       Medication List    TAKE these medications   cefdinir 300 MG capsule Commonly known as: OMNICEF Take 1 capsule (300 mg total) by mouth every 12 (twelve) hours for 10 days.   HYDROcodone-homatropine 5-1.5 MG/5ML syrup Commonly known as: HYCODAN Take 5 mLs by mouth 2 (two) times daily as needed for cough.   metFORMIN 500 MG tablet Commonly known as: Glucophage Take 1 tablet (500 mg total) by mouth 2 (two) times daily with a meal.   predniSONE 5 MG (21) Tbpk tablet Commonly known as: STERAPRED UNI-PAK 21 TAB Please read instructions on package Start taking on: June 27, 2019  Allergies  Allergen Reactions   Penicillins    Sulfa Antibiotics    Follow-up Information    Perrin Maltese, MD. Go on 07/23/2019.   Specialty: Internal Medicine Why: 9:30am  Contact information: [place] [phone]            The results of significant diagnostics from this hospitalization (including imaging, microbiology, ancillary and laboratory) are listed below for reference.    Significant Diagnostic Studies: Dg Chest 2 View  Result Date: 06/21/2019 CLINICAL DATA:  Suspected sepsis EXAM: CHEST - 2 VIEW COMPARISON:  None. FINDINGS: Mild cardiomegaly. Mild, diffuse interstitial pulmonary opacity. The visualized skeletal structures are unremarkable. IMPRESSION: 1. Mild, diffuse bilateral interstitial pulmonary opacity, which may reflect atypical or viral infection as well as edema. No focal airspace opacity.  2.  Cardiomegaly. Electronically Signed   By: Eddie Candle M.D.   On: 06/21/2019 15:45   Ct Head Wo Contrast  Result Date: 06/21/2019 CLINICAL DATA:  Headache, fever EXAM: CT HEAD WITHOUT CONTRAST TECHNIQUE: Contiguous axial images were obtained from the base of the skull through the vertex without intravenous contrast. COMPARISON:  None. FINDINGS: Brain: No acute intracranial abnormality. Specifically, no hemorrhage, hydrocephalus, mass lesion, acute infarction, or significant intracranial injury. Vascular: No hyperdense vessel or unexpected calcification. Skull: No acute calvarial abnormality. Sinuses/Orbits: Mucosal thickening throughout the paranasal sinuses. Air-fluid level in the left maxillary sinus. Other: None IMPRESSION: No intracranial abnormality. Acute on chronic sinusitis. Electronically Signed   By: Rolm Baptise M.D.   On: 06/21/2019 21:11   US Renal  Result Date: 06/22/2019 CLINICAL DATA:  Microscopic hematuria EXAM: RENAL / URINARY TRACT ULTRASOUND COMPLETE COMPARISON:  CT AP 06/21/2019. FINDINGS: Right Kidney: Renal measurements: 12.3 x 4.5 x 4.2 cm = volume: 122 mL . Echogenicity within normal limits. No mass or hydronephrosis visualized. Left Kidney: Renal measurements: 11.5 x 5.11 x 4.8 cm = volume: 146.3 mL. Echogenicity within normal limits. No mass or hydronephrosis visualized. Bladder: Appears normal for degree of bladder distention. Other: None. IMPRESSION: Normal renal sonogram. Electronically Signed   By: Kerby Moors M.D.   On: 06/22/2019 07:36   Ct Renal Stone Study  Result Date: 06/21/2019 CLINICAL DATA:  Hematuria, sepsis. Headache, body aches cough. EXAM: CT ABDOMEN AND PELVIS WITHOUT CONTRAST TECHNIQUE: Multidetector CT imaging of the abdomen and pelvis was performed following the standard protocol without IV contrast. COMPARISON:  None. FINDINGS: Lower chest: No sign of consolidation or evidence of pleural effusion. Hepatobiliary: Mild hepatic steatosis. No signs  of focal lesion on noncontrast exam. Focal fatty sparing at the gallbladder fossa. Post cholecystectomy. Mild extrahepatic biliary ductal distension may reflect post cholecystectomy baseline. Pancreas: Unremarkable. No pancreatic ductal dilatation or surrounding inflammatory changes. Spleen: Granulomas in the spleen. Adrenals/Urinary Tract: Adrenal glands are normal. Kidneys with normal contour. No signs of hydronephrosis. Stomach/Bowel: Small hiatal hernia. No signs of acute bowel process. The appendix is normal. Vascular/Lymphatic: Scattered atherosclerosis. No signs of aneurysm. No signs of retroperitoneal or upper abdominal lymphadenopathy. No signs of pelvic lymphadenopathy. Reproductive: Post hysterectomy. Other: No signs of free air. No ascites. Musculoskeletal: No signs of acute bone finding or evidence of destructive bone process. IMPRESSION: 1. No acute findings in the abdomen or pelvis. 2. Post hysterectomy and cholecystectomy. 3. Signs of hepatic steatosis. Electronically Signed   By: Zetta Bills M.D.   On: 06/21/2019 19:28    Microbiology: Recent Results (from the past 240 hour(s))  Culture, blood (Routine x 2)     Status:  None (Preliminary result)   Collection Time: 06/21/19  3:09 PM   Specimen: BLOOD  Result Value Ref Range Status   Specimen Description BLOOD RIGHT ANTECUBITAL  Final   Special Requests   Final    BOTTLES DRAWN AEROBIC AND ANAEROBIC Blood Culture results may not be optimal due to an excessive volume of blood received in culture bottles   Culture   Final    NO GROWTH 4 DAYS Performed at [facility], [address], [place], [place] [address]    Report Status PENDING  Incomplete  Group A Strep by PCR ([place] Only)     Status: Abnormal   Collection Time: 06/21/19  5:29 PM   Specimen: Throat; Sterile Swab  Result Value Ref Range Status   Group A Strep by PCR DETECTED (A) NOT DETECTED Final    Comment: Performed at [facility], [address] [place], [place] [address]  SARS CORONAVIRUS 2 (TAT 6-24 HRS) Nasopharyngeal Throat     Status: None   Collection Time: 06/21/19  5:29 PM   Specimen: Throat; Nasopharyngeal  Result Value Ref Range Status   SARS Coronavirus 2 NEGATIVE NEGATIVE Final    Comment: (NOTE) SARS-CoV-2 target nucleic acids are NOT DETECTED. The SARS-CoV-2 RNA is generally detectable in upper and lower respiratory specimens during the acute phase of infection. Negative results do not preclude SARS-CoV-2 infection, do not rule out co-infections with other pathogens, and should not be used as the sole basis for treatment or other patient management decisions. Negative results must be combined with clinical observations, patient history, and epidemiological information. The expected result is Negative. Fact Sheet for Patients: SugarRoll.be Fact Sheet for Healthcare Providers: https://www.woods-mathews.com/ This test is not yet approved or cleared by the [place] FDA and  has been authorized for detection and/or diagnosis of SARS-CoV-2 by FDA under an Emergency Use Authorization (EUA). This EUA will remain  in effect (meaning this test can be used) for the duration of the COVID-19 declaration under Section 56 4(b)(1) of the Act, 21 U.S.C. section 360bbb-3(b)(1), unless the authorization is terminated or revoked sooner. Performed at [facility], [place] [address], [place], [place] [address]   SARS CORONAVIRUS 2 (TAT 6-24 HRS) Nasopharyngeal Nasopharyngeal Swab     Status: None   Collection Time: 06/24/19  1:06 PM   Specimen: Nasopharyngeal Swab  Result Value Ref Range Status   SARS Coronavirus 2 NEGATIVE NEGATIVE Final    Comment: (NOTE) SARS-CoV-2 target nucleic acids are NOT DETECTED. The SARS-CoV-2 RNA is generally detectable in upper and lower respiratory specimens during the acute phase of infection. Negative results do not preclude SARS-CoV-2 infection,  do not rule out co-infections with other pathogens, and should not be used as the sole basis for treatment or other patient management decisions. Negative results must be combined with clinical observations, patient history, and epidemiological information. The expected result is Negative. Fact Sheet for Patients: SugarRoll.be Fact Sheet for Healthcare Providers: https://www.woods-mathews.com/ This test is not yet approved or cleared by the [place] FDA and  has been authorized for detection and/or diagnosis of SARS-CoV-2 by FDA under an Emergency Use Authorization (EUA). This EUA will remain  in effect (meaning this test can be used) for the duration of the COVID-19 declaration under Section 56 4(b)(1) of the Act, 21 U.S.C. section 360bbb-3(b)(1), unless the authorization is terminated or revoked sooner. Performed at [facility], [place] [address] [place], [place] [address]      Labs:  Basic Metabolic Panel: Recent Labs  Lab 06/21/19 1508 06/22/19 0528 06/24/19 0612  NA 138 136 142  K 3.8 3.8 4.0  CL 104 104 109  CO2 22 23 26   GLUCOSE 149* 173* 176*  BUN 12 9 12   CREATININE 0.76 0.67 0.45  CALCIUM 9.5 8.4* 8.7*  MG  --   --  2.4   Liver Function Tests: Recent Labs  Lab 06/21/19 1508  AST 25  ALT 23  ALKPHOS 86  BILITOT 1.2  PROT 8.1  ALBUMIN 4.5   No results for input(s): LIPASE, AMYLASE in the last 168 hours. No results for input(s): AMMONIA in the last 168 hours. CBC: Recent Labs  Lab 06/21/19 1508 06/22/19 0528 06/24/19 0612 06/25/19 0517  WBC 17.1* 17.2* 14.1* 13.2*  NEUTROABS 13.8*  --  12.4*  --   HGB 14.2 12.4 11.9* 11.2*  HCT 41.9 37.0 36.3 32.5*  MCV 91.1 93.0 93.6 90.0  PLT 243 174 196 211   Cardiac Enzymes: No results for input(s): CKTOTAL, CKMB, CKMBINDEX, TROPONINI in the last 168 hours. BNP: BNP (last 3 results) Recent Labs    06/21/19 1508  BNP 27.0    ProBNP (last 3  results) No results for input(s): PROBNP in the last 8760 hours.  CBG: Recent Labs  Lab 06/24/19 1643 06/24/19 2144 06/25/19 0730 06/25/19 0913 06/25/19 1145  GLUCAP 208* 162* 149* 185* 148*       Signed:  Kayleen Memos, MD [facility] 06/25/2019, 11:58 AM

## 2019-06-26 LAB — CULTURE, BLOOD (ROUTINE X 2): Culture: NO GROWTH

## 2019-08-21 ENCOUNTER — Encounter: Payer: Self-pay | Admitting: Unknown Physician Specialty

## 2019-08-21 ENCOUNTER — Other Ambulatory Visit: Payer: Self-pay

## 2019-08-23 NOTE — Anesthesia Preprocedure Evaluation (Addendum)
Anesthesia Evaluation  Patient identified by MRN, date of birth, ID band Patient awake    Reviewed: Allergy & Precautions, H&P , NPO status , Patient's Chart, lab work & pertinent test results  Airway Mallampati: II  TM Distance: >3 FB Neck ROM: full    Dental no notable dental hx.    Pulmonary Current SmokerPatient did not abstain from smoking.,    Pulmonary exam normal breath sounds clear to auscultation       Cardiovascular hypertension, Normal cardiovascular exam Rhythm:regular Rate:Normal     Neuro/Psych  Headaches,    GI/Hepatic   Endo/Other  diabetes, Type 2  Renal/GU      Musculoskeletal   Abdominal   Peds  Hematology   Anesthesia Other Findings Hospitalized 06/21/2019 - 06/25/2019 for sepsis 2/2 strep pharyngitis  Cardiology eval for chest pain 07/10/2019: Recommended CCTA and echo, increased losartan dose.  Cardiac CT: normal coronaries  TTE 07/16/2019:  Borderline dilated left atrium Normal LV function and wall motion Mild LVH Trace-mild PR Trace-mild TR Borderline pulm HTN Mild MR  PCP follow up 08/14/2018: no further chest pain. Encouraged smoking cessation (she plans to quit prior to surgery).  Reproductive/Obstetrics                          Anesthesia Physical Anesthesia Plan  ASA: II  Anesthesia Plan: General ETT   Post-op Pain Management:    Induction:   PONV Risk Score and Plan: 3 and Ondansetron, Dexamethasone, Scopolamine patch - Pre-op and Treatment may vary due to age or medical condition  Airway Management Planned:   Additional Equipment:   Intra-op Plan:   Post-operative Plan:   Informed Consent: I have reviewed the patients History and Physical, chart, labs and discussed the procedure including the risks, benefits and alternatives for the proposed anesthesia with the patient or authorized representative who has indicated his/her understanding and  acceptance.       Plan Discussed with: CRNA  Anesthesia Plan Comments:         Anesthesia Quick Evaluation

## 2019-08-28 ENCOUNTER — Other Ambulatory Visit: Payer: Self-pay

## 2019-08-28 ENCOUNTER — Other Ambulatory Visit
Admission: RE | Admit: 2019-08-28 | Discharge: 2019-08-28 | Disposition: A | Discharge status: Home / Self Care | Payer: 59 | Source: Ambulatory Visit | Attending: Unknown Physician Specialty | Admitting: Unknown Physician Specialty

## 2019-08-28 DIAGNOSIS — Z20822 Contact with and (suspected) exposure to covid-19: Secondary | ICD-10-CM | POA: Diagnosis not present

## 2019-08-28 DIAGNOSIS — Z01812 Encounter for preprocedural laboratory examination: Secondary | ICD-10-CM | POA: Diagnosis present

## 2019-08-29 LAB — SARS CORONAVIRUS 2 (TAT 6-24 HRS): SARS Coronavirus 2: NEGATIVE

## 2019-08-29 NOTE — Discharge Instructions (Signed)
[facility] ENDOSCOPIC SINUS SURGERY [facility]  What is Functional Endoscopic Sinus Surgery?  The Surgery involves making the natural openings of the sinuses larger by removing the bony partitions that separate the sinuses from the nasal cavity.  The natural sinus lining is preserved as much as possible to allow the sinuses to resume normal function after the surgery.  In some patients nasal polyps (excessively swollen lining of the sinuses) may be removed to relieve obstruction of the sinus openings.  The surgery is performed through the nose using lighted scopes, which eliminates the need for incisions on the face.  A septoplasty is a different procedure which is sometimes performed with sinus surgery.  It involves straightening the boy partition that separates the two sides of your nose.  A crooked or deviated septum may need repair if is obstructing the sinuses or nasal airflow.  Turbinate reduction is also often performed during sinus surgery.  The turbinates are bony proturberances from the side walls of the nose which swell and can obstruct the nose in patients with sinus and allergy problems.  Their size can be surgically reduced to help relieve nasal obstruction.  What Can Sinus Surgery Do For Me?  Sinus surgery can reduce the frequency of sinus infections requiring antibiotic treatment.  This can provide improvement in nasal congestion, post-nasal drainage, facial pressure and nasal obstruction.  Surgery will NOT prevent you from ever having an infection again, so it usually only for patients who get infections 4 or more times yearly requiring antibiotics, or for infections that do not clear with antibiotics.  It will not cure nasal allergies, so patients with allergies may still require medication to treat their allergies after surgery. Surgery may improve headaches related to sinusitis, however, some people will continue to  require medication to control sinus headaches related to allergies.  Surgery will do nothing for other forms of headache (migraine, tension or cluster).  What Are the Risks of Endoscopic Sinus Surgery?  Current techniques allow surgery to be performed safely with little risk, however, there are rare complications that patients should be aware of.  Because the sinuses are located around the eyes, there is risk of eye injury, including blindness, though again, this would be quite rare. This is usually a result of bleeding behind the eye during surgery, which puts the vision oat risk, though there are treatments to protect the vision and prevent permanent disrupted by surgery causing a leak of the spinal fluid that surrounds the brain.  More serious complications would include bleeding inside the brain cavity or damage to the brain.  Again, all of these complications are uncommon, and spinal fluid leaks can be safely managed surgically if they occur.  The most common complication of sinus surgery is bleeding from the nose, which may require packing or cauterization of the nose.  Continued sinus have polyps may experience recurrence of the polyps requiring revision surgery.  Alterations of sense of smell or injury to the tear ducts are also rare complications.   What is the Surgery Like, and what is the Recovery?  The Surgery usually takes a couple of hours to perform, and is usually performed under a general anesthetic (completely asleep).  Patients are usually discharged home after a couple of hours.  Sometimes during surgery it is necessary to pack the nose to control bleeding, and the packing is left in place for 24 - 48 hours, and removed by your surgeon.    If a septoplasty was performed during the procedure, there is often a splint placed which must be removed after 5-7 days.   Discomfort: Pain is usually mild to moderate, and can be controlled by prescription pain medication or acetaminophen (Tylenol).   Aspirin, Ibuprofen (Advil, Motrin), or Naprosyn (Aleve) should be avoided, as they can cause increased bleeding.  Most patients feel sinus pressure like they have a bad head cold for several days.  Sleeping with your head elevated can help reduce swelling and facial pressure, as can ice packs over the face.  A humidifier may be helpful to keep the mucous and blood from drying in the nose.   Diet: There are no specific diet restrictions, however, you should generally start with clear liquids and a light diet of bland foods because the anesthetic can cause some nausea.  Advance your diet depending on how your stomach feels.  Taking your pain medication with food will often help reduce stomach upset which pain medications can cause.  Nasal Saline Irrigation: It is important to remove blood clots and dried mucous from the nose as it is healing.  This is done by having you irrigate the nose at least 3 - 4 times daily with a salt water solution.  We recommend using NeilMed Sinus Rinse (available at the drug store).  Fill the squeeze bottle with the solution, bend over a sink, and insert the tip of the squeeze bottle into the nose  of an inch.  Point the tip of the squeeze bottle towards the inside corner of the eye on the same side your irrigating.  Squeeze the bottle and gently irrigate the nose.  If you bend forward as you do this, most of the fluid will flow back out of the nose, instead of down your throat.   The solution should be warm, near body temperature, when you irrigate.   Each time you irrigate, you should use a full squeeze bottle.   Note that if you are instructed to use Nasal Steroid Sprays at any time after your surgery, irrigate with saline BEFORE using the steroid spray, so you do not wash it all out of the nose. Another product, Nasal Saline Gel (such as AYR Nasal Saline Gel) can be applied in each nostril 3 - 4 times daily to moisture the nose and reduce scabbing or crusting.  Bleeding:   Bloody drainage from the nose can be expected for several days, and patients are instructed to irrigate their nose frequently with salt water to help remove mucous and blood clots.  The drainage may be dark red or brown, though some fresh blood may be seen intermittently, especially after irrigation.  Do not blow you nose, as bleeding may occur. If you must sneeze, keep your mouth open to allow air to escape through your mouth.  If heavy bleeding occurs: Irrigate the nose with saline to rinse out clots, then spray the nose 3 - 4 times with Afrin Nasal Decongestant Spray.  The spray will constrict the blood vessels to slow bleeding.  Pinch the lower half of your nose shut to apply pressure, and lay down with your head elevated.  Ice packs over the nose may help as well. If bleeding persists despite these measures, you should notify your doctor.  Do not use the Afrin routinely to control nasal congestion after surgery, as it can result in worsening congestion and may affect healing.     Activity: Return to work varies among patients. Most patients will be   out of work at least 5 - 7 days to recover.  Patient may return to work after they are off of narcotic pain medication, and feeling well enough to perform the functions of their job.  Patients must avoid heavy lifting (over 10 pounds) or strenuous physical for 2 weeks after surgery, so your employer may need to assign you to light duty, or keep you out of work longer if light duty is not possible.  NOTE: you should not drive, operate dangerous machinery, do any mentally demanding tasks or make any important legal or financial decisions while on narcotic pain medication and recovering from the general anesthetic.    Call Your Doctor Immediately if You Have Any of the Following: 1. Bleeding that you cannot control with the above measures 2. Loss of vision, double vision, bulging of the eye or black eyes. 3. Fever over 101 degrees 4. Neck stiffness with  severe headache, fever, nausea and change in mental state. You are always encourage to call anytime with concerns, however, please call with requests for pain medication refills during office hours.  Office Endoscopy: During follow-up visits your doctor will remove any packing or splints that may have been placed and evaluate and clean your sinuses endoscopically.  Topical anesthetic will be used to make this as comfortable as possible, though you may want to take your pain medication prior to the visit.  How often this will need to be done varies from patient to patient.  After complete recovery from the surgery, you may need follow-up endoscopy from time to time, particularly if there is concern of recurrent infection or nasal polyps.  General Anesthesia, Adult, Care After This sheet gives you information about how to care for yourself after your procedure. Your health care provider may also give you more specific instructions. If you have problems or questions, contact your health care provider. What can I expect after the procedure? After the procedure, the following side effects are common:  Pain or discomfort at the IV site.  Nausea.  Vomiting.  Sore throat.  Trouble concentrating.  Feeling cold or chills.  Weak or tired.  Sleepiness and fatigue.  Soreness and body aches. These side effects can affect parts of the body that were not involved in surgery. Follow these instructions at home:  For at least 24 hours after the procedure:  Have a responsible adult stay with you. It is important to have someone help care for you until you are awake and alert.  Rest as needed.  Do not: ? Participate in activities in which you could fall or become injured. ? Drive. ? Use heavy machinery. ? Drink alcohol. ? Take sleeping pills or medicines that cause drowsiness. ? Make important decisions or sign legal documents. ? Take care of children on your own. Eating and drinking  Follow  any instructions from your health care provider about eating or drinking restrictions.  When you feel hungry, start by eating small amounts of foods that are soft and easy to digest (bland), such as toast. Gradually return to your regular diet.  Drink enough fluid to keep your urine pale yellow.  If you vomit, rehydrate by drinking water, juice, or clear broth. General instructions  If you have sleep apnea, surgery and certain medicines can increase your risk for breathing problems. Follow instructions from your health care provider about wearing your sleep device: ? Anytime you are sleeping, including during daytime naps. ? While taking prescription pain medicines, sleeping medicines, or medicines that   make you drowsy.  Return to your normal activities as told by your health care provider. Ask your health care provider what activities are safe for you.  Take over-the-counter and prescription medicines only as told by your health care provider.  If you smoke, do not smoke without supervision.  Keep all follow-up visits as told by your health care provider. This is important. Contact a health care provider if:  You have nausea or vomiting that does not get better with medicine.  You cannot eat or drink without vomiting.  You have pain that does not get better with medicine.  You are unable to pass urine.  You develop a skin rash.  You have a fever.  You have redness around your IV site that gets worse. Get help right away if:  You have difficulty breathing.  You have chest pain.  You have blood in your urine or stool, or you vomit blood. Summary  After the procedure, it is common to have a sore throat or nausea. It is also common to feel tired.  Have a responsible adult stay with you for the first 24 hours after general anesthesia. It is important to have someone help care for you until you are awake and alert.  When you feel hungry, start by eating small amounts of  foods that are soft and easy to digest (bland), such as toast. Gradually return to your regular diet.  Drink enough fluid to keep your urine pale yellow.  Return to your normal activities as told by your health care provider. Ask your health care provider what activities are safe for you. This information is not intended to replace advice given to you by your health care provider. Make sure you discuss any questions you have with your health care provider. Document Revised: 07/28/2017 Document Reviewed: 03/10/2017 Elsevier Patient Education  2020 [employer]  

## 2019-08-30 ENCOUNTER — Ambulatory Visit
Admission: RE | Admit: 2019-08-30 | Discharge: 2019-08-30 | Disposition: A | Discharge status: Home / Self Care | Payer: 59 | Attending: Unknown Physician Specialty | Admitting: Unknown Physician Specialty

## 2019-08-30 ENCOUNTER — Encounter
Admission: RE | Disposition: A | Discharge status: Home / Self Care | Payer: Self-pay | Source: Home / Self Care | Attending: Unknown Physician Specialty

## 2019-08-30 ENCOUNTER — Ambulatory Visit: Payer: 59 | Admitting: Anesthesiology

## 2019-08-30 ENCOUNTER — Encounter: Payer: Self-pay | Admitting: Unknown Physician Specialty

## 2019-08-30 ENCOUNTER — Other Ambulatory Visit: Payer: Self-pay

## 2019-08-30 DIAGNOSIS — E785 Hyperlipidemia, unspecified: Secondary | ICD-10-CM | POA: Insufficient documentation

## 2019-08-30 DIAGNOSIS — R519 Headache, unspecified: Secondary | ICD-10-CM | POA: Diagnosis not present

## 2019-08-30 DIAGNOSIS — Z809 Family history of malignant neoplasm, unspecified: Secondary | ICD-10-CM | POA: Insufficient documentation

## 2019-08-30 DIAGNOSIS — J322 Chronic ethmoidal sinusitis: Secondary | ICD-10-CM | POA: Insufficient documentation

## 2019-08-30 DIAGNOSIS — E119 Type 2 diabetes mellitus without complications: Secondary | ICD-10-CM | POA: Diagnosis not present

## 2019-08-30 DIAGNOSIS — F17200 Nicotine dependence, unspecified, uncomplicated: Secondary | ICD-10-CM | POA: Diagnosis not present

## 2019-08-30 DIAGNOSIS — J342 Deviated nasal septum: Secondary | ICD-10-CM | POA: Insufficient documentation

## 2019-08-30 DIAGNOSIS — I10 Essential (primary) hypertension: Secondary | ICD-10-CM | POA: Diagnosis not present

## 2019-08-30 DIAGNOSIS — Z90710 Acquired absence of both cervix and uterus: Secondary | ICD-10-CM | POA: Insufficient documentation

## 2019-08-30 DIAGNOSIS — H9319 Tinnitus, unspecified ear: Secondary | ICD-10-CM | POA: Insufficient documentation

## 2019-08-30 DIAGNOSIS — Z7982 Long term (current) use of aspirin: Secondary | ICD-10-CM | POA: Diagnosis not present

## 2019-08-30 DIAGNOSIS — Z880 Allergy status to penicillin: Secondary | ICD-10-CM | POA: Diagnosis not present

## 2019-08-30 DIAGNOSIS — Z9049 Acquired absence of other specified parts of digestive tract: Secondary | ICD-10-CM | POA: Diagnosis not present

## 2019-08-30 DIAGNOSIS — Z882 Allergy status to sulfonamides status: Secondary | ICD-10-CM | POA: Insufficient documentation

## 2019-08-30 DIAGNOSIS — J320 Chronic maxillary sinusitis: Secondary | ICD-10-CM | POA: Insufficient documentation

## 2019-08-30 DIAGNOSIS — J343 Hypertrophy of nasal turbinates: Secondary | ICD-10-CM | POA: Insufficient documentation

## 2019-08-30 DIAGNOSIS — Z79899 Other long term (current) drug therapy: Secondary | ICD-10-CM | POA: Insufficient documentation

## 2019-08-30 DIAGNOSIS — Z836 Family history of other diseases of the respiratory system: Secondary | ICD-10-CM | POA: Diagnosis not present

## 2019-08-30 DIAGNOSIS — J3489 Other specified disorders of nose and nasal sinuses: Secondary | ICD-10-CM | POA: Insufficient documentation

## 2019-08-30 HISTORY — PX: MAXILLARY ANTROSTOMY: SHX2003

## 2019-08-30 HISTORY — PX: NASAL SEPTOPLASTY W/ TURBINOPLASTY: SHX2070

## 2019-08-30 HISTORY — PX: IMAGE GUIDED SINUS SURGERY: SHX6570

## 2019-08-30 HISTORY — PX: ETHMOIDECTOMY: SHX5197

## 2019-08-30 HISTORY — PX: FRONTAL SINUS EXPLORATION: SHX6591

## 2019-08-30 HISTORY — DX: Headache, unspecified: R51.9

## 2019-08-30 HISTORY — DX: Presence of dental prosthetic device (complete) (partial): Z97.2

## 2019-08-30 LAB — GLUCOSE, CAPILLARY
Glucose-Capillary: 153 mg/dL — ABNORMAL HIGH (ref 70–99)
Glucose-Capillary: 163 mg/dL — ABNORMAL HIGH (ref 70–99)

## 2019-08-30 SURGERY — SINUS SURGERY, WITH IMAGING GUIDANCE
Anesthesia: General | Site: Nose

## 2019-08-30 MED ORDER — OXYCODONE HCL 5 MG/5ML PO SOLN: 5.0000 mg | Freq: Once | ORAL | Status: AC | PRN

## 2019-08-30 MED ORDER — MIDAZOLAM HCL 5 MG/5ML IJ SOLN
INTRAMUSCULAR | Status: DC | PRN
  Administered 2019-08-30: 2 mg via INTRAVENOUS

## 2019-08-30 MED ORDER — PROPOFOL 10 MG/ML IV BOLUS
INTRAVENOUS | Status: DC | PRN
  Administered 2019-08-30: 130 mg via INTRAVENOUS

## 2019-08-30 MED ORDER — CLINDAMYCIN HCL 300 MG PO CAPS: 300.0000 mg | ORAL_CAPSULE | Freq: Three times a day (TID) | ORAL | 0 refills | Status: AC

## 2019-08-30 MED ORDER — LACTATED RINGERS IV SOLN: INTRAVENOUS | Status: DC | PRN

## 2019-08-30 MED ORDER — SUCCINYLCHOLINE CHLORIDE 20 MG/ML IJ SOLN
INTRAMUSCULAR | Status: DC | PRN
  Administered 2019-08-30: 100 mg via INTRAVENOUS

## 2019-08-30 MED ORDER — ONDANSETRON HCL 4 MG/2ML IJ SOLN
4.0000 mg | Freq: Once | INTRAMUSCULAR | Status: AC | PRN
  Administered 2019-08-30: 4 mg via INTRAVENOUS

## 2019-08-30 MED ORDER — DEXAMETHASONE SODIUM PHOSPHATE 4 MG/ML IJ SOLN
INTRAMUSCULAR | Status: DC | PRN
  Administered 2019-08-30: 10 mg via INTRAVENOUS

## 2019-08-30 MED ORDER — PHENYLEPHRINE HCL 0.5 % NA SOLN
NASAL | Status: DC | PRN
  Administered 2019-08-30: 30 mL via TOPICAL

## 2019-08-30 MED ORDER — LIDOCAINE-EPINEPHRINE 1 %-1:100000 IJ SOLN
INTRAMUSCULAR | Status: DC | PRN
  Administered 2019-08-30: 9 mL

## 2019-08-30 MED ORDER — ACETAMINOPHEN 10 MG/ML IV SOLN
1000.0000 mg | Freq: Once | INTRAVENOUS | Status: AC
  Administered 2019-08-30: 1000 mg via INTRAVENOUS

## 2019-08-30 MED ORDER — ONDANSETRON HCL 4 MG/2ML IJ SOLN
INTRAMUSCULAR | Status: DC | PRN
  Administered 2019-08-30: 4 mg via INTRAVENOUS

## 2019-08-30 MED ORDER — SCOPOLAMINE 1 MG/3DAYS TD PT72
1.0000 | MEDICATED_PATCH | Freq: Once | TRANSDERMAL | Status: DC
  Administered 2019-08-30: 1.5 mg via TRANSDERMAL

## 2019-08-30 MED ORDER — GLYCOPYRROLATE 0.2 MG/ML IJ SOLN
INTRAMUSCULAR | Status: DC | PRN
  Administered 2019-08-30: .1 mg via INTRAVENOUS

## 2019-08-30 MED ORDER — OXYCODONE HCL 5 MG PO TABS
5.0000 mg | ORAL_TABLET | Freq: Once | ORAL | Status: AC | PRN
  Administered 2019-08-30: 5 mg via ORAL

## 2019-08-30 MED ORDER — FENTANYL CITRATE (PF) 100 MCG/2ML IJ SOLN
INTRAMUSCULAR | Status: DC | PRN
  Administered 2019-08-30 (×2): 50 ug via INTRAVENOUS

## 2019-08-30 MED ORDER — HYDROCODONE-ACETAMINOPHEN 5-300 MG PO TABS: 1.0000 | ORAL_TABLET | ORAL | 0 refills | Status: DC | PRN

## 2019-08-30 MED ORDER — OXYMETAZOLINE HCL 0.05 % NA SOLN
6.0000 | Freq: Once | NASAL | Status: AC
  Administered 2019-08-30: 6 via NASAL

## 2019-08-30 MED ORDER — EPHEDRINE SULFATE 50 MG/ML IJ SOLN
INTRAMUSCULAR | Status: DC | PRN
  Administered 2019-08-30: 10 mg via INTRAVENOUS
  Administered 2019-08-30 (×2): 5 mg via INTRAVENOUS

## 2019-08-30 MED ORDER — BACITRACIN-NEOMYCIN-POLYMYXIN 400-5-5000 EX OINT
TOPICAL_OINTMENT | CUTANEOUS | Status: DC | PRN
  Administered 2019-08-30: 1 via TOPICAL

## 2019-08-30 MED ORDER — LIDOCAINE HCL (CARDIAC) PF 100 MG/5ML IV SOSY
PREFILLED_SYRINGE | INTRAVENOUS | Status: DC | PRN
  Administered 2019-08-30: 40 mg via INTRAVENOUS

## 2019-08-30 MED ORDER — FENTANYL CITRATE (PF) 100 MCG/2ML IJ SOLN
25.0000 ug | INTRAMUSCULAR | Status: DC | PRN
  Administered 2019-08-30: 50 ug via INTRAVENOUS

## 2019-08-30 SURGICAL SUPPLY — 33 items
BATTERY INSTRU NAVIGATION (MISCELLANEOUS) ×12 IMPLANT
CANISTER SUCT 1200ML W/VALVE (MISCELLANEOUS) ×4 IMPLANT
COAG SUCT 10F 3.5MM HAND CTRL (MISCELLANEOUS) ×4 IMPLANT
CUP MEDICINE 2OZ PLAST GRAD ST (MISCELLANEOUS) ×4 IMPLANT
DRAPE HEAD BAR (DRAPES) ×4 IMPLANT
DRESSING NASL FOAM PST OP SINU (MISCELLANEOUS) ×4 IMPLANT
DRSG NASAL FOAM POST OP SINU (MISCELLANEOUS) ×8
ELECT REM PT RETURN 9FT ADLT (ELECTROSURGICAL) ×4
ELECTRODE REM PT RTRN 9FT ADLT (ELECTROSURGICAL) ×2 IMPLANT
GLOVE BIO SURGEON STRL SZ7.5 (GLOVE) ×8 IMPLANT
HANDLE YANKAUER SUCT BULB TIP (MISCELLANEOUS) ×4 IMPLANT
KIT TURNOVER KIT A (KITS) ×4 IMPLANT
NDL HYPO 25GX1X1/2 BEV (NEEDLE) ×2 IMPLANT
NEEDLE HYPO 25GX1X1/2 BEV (NEEDLE) ×4 IMPLANT
NS IRRIG 500ML POUR BTL (IV SOLUTION) ×4 IMPLANT
PACK ENT CUSTOM (PACKS) ×4 IMPLANT
SOL ANTI-FOG 6CC FOG-OUT (MISCELLANEOUS) ×2 IMPLANT
SOL FOG-OUT ANTI-FOG 6CC (MISCELLANEOUS) ×2
SPLINT NASAL SEPTAL BLV .50 ST (MISCELLANEOUS) ×2 IMPLANT
SPONGE NEURO XRAY DETECT 1X3 (DISPOSABLE) ×4 IMPLANT
STRAP BODY AND KNEE 60X3 (MISCELLANEOUS) ×4 IMPLANT
SUT CHROMIC 3-0 (SUTURE) ×2
SUT CHROMIC 3-0 KS 27XMFL CR (SUTURE) ×2
SUT ETHILON 3-0 KS 30 BLK (SUTURE) ×4 IMPLANT
SUT PLAIN GUT 4-0 (SUTURE) ×2 IMPLANT
SUTURE CHRMC 3-0 KS 27XMFL CR (SUTURE) ×2 IMPLANT
SYR 10ML LL (SYRINGE) ×4 IMPLANT
TOWEL OR 17X26 4PK STRL BLUE (TOWEL DISPOSABLE) ×4 IMPLANT
TRACKER CRANIALMASK (MASK) ×4 IMPLANT
TRAP SPECIMEN MUCOUS 40CC (MISCELLANEOUS) ×2 IMPLANT
TUBING CONNECTING 10 (TUBING) ×3 IMPLANT
TUBING CONNECTING 10' (TUBING) ×1
WATER STERILE IRR 250ML POUR (IV SOLUTION) ×4 IMPLANT

## 2019-08-30 NOTE — Transfer of Care (Signed)
Immediate Anesthesia Transfer of Care Note  Patient: Nancy Campos  Procedure(s) Performed: IMAGE GUIDED SINUS SURGERY (N/A Nose) NASAL SEPTOPLASTY WITH TURBINATE REDUCTION (Bilateral Nose) MAXILLARY ANTROSTOMY WITH TISSUE REMOVAL (Bilateral Nose) ETHMOIDECTOMY (Bilateral ) FRONTAL SINUS EXPLORATION (Bilateral )  Patient Location: PACU  Anesthesia Type: General ETT  Level of Consciousness: awake, alert  and patient cooperative  Airway and Oxygen Therapy: Patient Spontanous Breathing and Patient connected to supplemental oxygen  Post-op Assessment: Post-op Vital signs reviewed, Patient's Cardiovascular Status Stable, Respiratory Function Stable, Patent Airway and No signs of Nausea or vomiting  Post-op Vital Signs: Reviewed and stable  Complications: No apparent anesthesia complications

## 2019-08-30 NOTE — Anesthesia Postprocedure Evaluation (Signed)
Anesthesia Post Note  Patient: Nancy Campos  Procedure(s) Performed: IMAGE GUIDED SINUS SURGERY (N/A Nose) NASAL SEPTOPLASTY WITH TURBINATE REDUCTION (Bilateral Nose) MAXILLARY ANTROSTOMY WITH TISSUE REMOVAL (Bilateral Nose) ETHMOIDECTOMY (Bilateral Nose) FRONTAL SINUS EXPLORATION (Bilateral Nose)     Patient location during evaluation: PACU Anesthesia Type: General Level of consciousness: awake and alert and oriented Pain management: satisfactory to patient Vital Signs Assessment: post-procedure vital signs reviewed and stable Respiratory status: spontaneous breathing, nonlabored ventilation and respiratory function stable Cardiovascular status: blood pressure returned to baseline and stable Postop Assessment: Adequate PO intake and No signs of nausea or vomiting Anesthetic complications: no    Raliegh Ip

## 2019-08-30 NOTE — Anesthesia Procedure Notes (Signed)
Procedure Name: Intubation Date/Time: 08/30/2019 8:35 AM Performed by: Mayme Genta, CRNA Pre-anesthesia Checklist: Patient identified, Emergency Drugs available, Suction available, Patient being monitored and Timeout performed Patient Re-evaluated:Patient Re-evaluated prior to induction Oxygen Delivery Method: Circle system utilized Preoxygenation: Pre-oxygenation with 100% oxygen Induction Type: IV induction Ventilation: Mask ventilation without difficulty Laryngoscope Size: Miller and 2 Grade View: Grade I Tube type: Oral Rae Tube size: 7.0 mm Number of attempts: 1 Placement Confirmation: ETT inserted through vocal cords under direct vision,  positive ETCO2 and breath sounds checked- equal and bilateral Tube secured with: Tape Dental Injury: Teeth and Oropharynx as per pre-operative assessment

## 2019-08-30 NOTE — H&P (Signed)
The patient's history has been reviewed, patient examined, no change in status, stable for surgery.  Questions were answered to the patients satisfaction.  

## 2019-08-30 NOTE — Op Note (Signed)
PREOPERATIVE DIAGNOSIS:  Chronic nasal obstruction.  Chronic bilateral maxillary sinusitis, left chronic frontal ethmoid sinusitis  POSTOPERATIVE DIAGNOSIS:  Chronic nasal obstruction.  SURGEON:  Roena Malady, M.D.  NAME OF PROCEDURE:   #1 use of Stryker navigation system #2 bilateral endoscopic maxillary antrostomy with removal of tissue #3 left endoscopic ethmoidectomy removal of tissue #4 left endoscopic frontal sinusotomy with removal of tissue #5 nasal septoplasty #6 submucous resection of inferior turbinates.  OPERATIVE FINDINGS:  Severe nasal septal deformity, hypertrophy of the inferior turbinates.  Thick mucopurulent debris in the left maxillary sinus with thickened mucosa, mucosal thickening in the right maxillary sinus, polypoid disease in the left frontal ethmoid complex.  DESCRIPTION OF THE PROCEDURE:  Nancy Campos was identified in the holding area and taken to the operating room and placed in the supine position.  After general endotracheal anesthesia was induced, the table was turned 45 degrees and the patient was placed in a semi-Fowler position.  The nose was then topically anesthetized with Lidocaine, cotton pledgets were placed within each nostril. After approximately 5 minutes, this was removed at which time a local anesthetic of 1% Lidocaine 1:100,000 units of Epinephrine was used to inject the inferior turbinates in the nasal septum. A total of 9 ml was used. Examination of the nose showed a severe left nasal septal deformity and tremendous hypertrophied inferior turbinate.    The operation began with the endoscopic portion.  Stryker navigation system was applied and calibrated and remained on throughout the procedure.  Using the Stryker navigation device on the left-hand side the middle turbinate was gently medialized.  The uncinate process was identified and taken down using the caudal elevator.  The sinus was opened using straight and side-biting forceps.  Curved suction  was placed into the sinus there was thick mucopurulent mucus within the sinus this was suctioned in a trap for and sent for culture.  The maxillary sinus widely patent and the ethmoid bulla was identified and opened using the Stryker navigation tools.  There was polypoid disease in the ethmoid cavity which was removed.  The ethmoid was then approached superiorly and anteriorly opening up into the frontal sinus.  Again there was polypoid mucosa which was removed.  This gave excellent access to the frontal sinus which was open.  With the left side completed the right side was addressed the right middle turbinate was gently medialized the uncinate process was identified and taken down using the caudal elevator.  The maxillary sinus was then entered using the curved suction and the straight and side-biting forceps were used to open the sinus widely.  There is polypoid disease which was removed.  With the sinus portion completed the operation then turned to the septoplasty and turbinate reduction.   Beginning on the right hand side a hemitransfixion incision was then created on the leading edge of the septum on the right.  A subperichondrial plane was elevated posteriorly on the left and taken back to the perpendicular plate of the ethmoid where subperiosteal plane was elevated posteriorly on the left. A large septal spur was identified on the left hand side impacting on the inferior turbinate.  An inferior rim of cartilage was removed anteriorly with care taken to leave an anterior strut to prevent nasal collapse. With this strut removed the perpendicular plate of the ethmoid was separated from the quadrangular cartilage. The large septal spur was removed.  The septum was then replaced in the midline. Reinspection through each nostril showed excellent reduction of the  septal deformity. A left posterior inferior fenestration was then created to allow hematoma drainage.  With the septoplasty completed, beginning on the  left-hand side, a 15 blade was used to incise along the inferior edge of the inferior turbinate. A superior laterally based flap was then elevated. The underlying conchal bone of mucosa was excised using Knight scissors. The flap was then laid back over the turbinate stump and cauterized using suction cautery. In a similar fashion the submucous resection was performed on the right.  With the submucous resection completed bilaterally and no active bleeding, the hemitransfixion incision was then closed using two interrupted 3-0 chromic sutures.  Plastic nasal septal splints were placed within each nostril and affixed to the septum using a 3-0 nylon suture. Stammberger was then used beneath each inferior turbinate for hemostasis.    The patient tolerated the procedure well, was returned to anesthesia, extubated in the operating room, and taken to the recovery room in stable condition.    CULTURES:  Left maxillary sinus  SPECIMENS:  Sinus contents  ESTIMATED BLOOD LOSS:  25 cc.  Roena Malady  08/30/2019  9:34 AM

## 2019-09-03 LAB — SURGICAL PATHOLOGY

## 2019-09-04 LAB — AEROBIC/ANAEROBIC CULTURE W GRAM STAIN (SURGICAL/DEEP WOUND): Culture: NORMAL

## 2019-11-17 ENCOUNTER — Emergency Department (HOSPITAL_COMMUNITY)
Admission: EM | Admit: 2019-11-17 | Discharge: 2019-11-17 | Disposition: A | Discharge status: Home / Self Care | Payer: 59 | Attending: Emergency Medicine | Admitting: Emergency Medicine

## 2019-11-17 ENCOUNTER — Emergency Department (HOSPITAL_COMMUNITY): Payer: 59

## 2019-11-17 ENCOUNTER — Encounter (HOSPITAL_COMMUNITY): Payer: Self-pay | Admitting: Emergency Medicine

## 2019-11-17 ENCOUNTER — Other Ambulatory Visit: Payer: Self-pay

## 2019-11-17 DIAGNOSIS — F17210 Nicotine dependence, cigarettes, uncomplicated: Secondary | ICD-10-CM | POA: Diagnosis not present

## 2019-11-17 DIAGNOSIS — E119 Type 2 diabetes mellitus without complications: Secondary | ICD-10-CM | POA: Diagnosis not present

## 2019-11-17 DIAGNOSIS — Z79899 Other long term (current) drug therapy: Secondary | ICD-10-CM | POA: Diagnosis not present

## 2019-11-17 DIAGNOSIS — I10 Essential (primary) hypertension: Secondary | ICD-10-CM | POA: Diagnosis not present

## 2019-11-17 DIAGNOSIS — M25522 Pain in left elbow: Secondary | ICD-10-CM | POA: Insufficient documentation

## 2019-11-17 DIAGNOSIS — Z7984 Long term (current) use of oral hypoglycemic drugs: Secondary | ICD-10-CM | POA: Insufficient documentation

## 2019-11-17 MED ORDER — DICLOFENAC SODIUM 1 % EX GEL: 2.0000 g | Freq: Four times a day (QID) | CUTANEOUS | 0 refills | Status: DC

## 2019-11-17 MED ORDER — KETOROLAC TROMETHAMINE 30 MG/ML IJ SOLN
30.0000 mg | Freq: Once | INTRAMUSCULAR | Status: AC
  Administered 2019-11-17: 13:00:00 30 mg via INTRAMUSCULAR
  Filled 2019-11-17: qty 1

## 2019-11-17 MED ORDER — DICLOFENAC SODIUM 1 % EX GEL: 2.0000 g | Freq: Four times a day (QID) | CUTANEOUS | 0 refills | Status: AC

## 2019-11-17 NOTE — ED Provider Notes (Addendum)
[facility] EMERGENCY DEPARTMENT Provider Note   CSN: [id number] Arrival date & time: 11/17/19  1133     History Chief Complaint  Patient presents with  . Arm Pain    Nancy Campos is a 59 y.o. female.  HPI   Pt is a 59 y/o female with a h/o DM, HA, HLD, HTN, wears dentures, who presents to the ED today for eval of LUE pain that started 4 days ago. States that her daily activities are limited due to pain. Denies any numbness to the LUE. Pain feels "tense" and "piercing". Rates pain 8/10 and states it is constant. Pain is worse with certain movements or trying to lift anything.  Denies any neck pain. Denies LUE swelling, redness.   Denies any known injuries.  Past Medical History:  Diagnosis Date  . Diabetes mellitus without complication ([place])   . Headache    sinus  . Hypercholesteremia   . Hypertension   . Wears dentures    partial upper    Patient Active Problem List   Diagnosis Date Noted  . Streptococcal pharyngitis 06/21/2019    Past Surgical History:  Procedure Laterality Date  . ABDOMINAL HYSTERECTOMY  1997  . BREAST BIOPSY Right 2013   benign  . BREAST EXCISIONAL BIOPSY Right 2007   benign  . ETHMOIDECTOMY Bilateral 08/30/2019   Procedure: ETHMOIDECTOMY;  Surgeon: Beverly Gust, MD;  Location: [place];  Service: ENT;  Laterality: Bilateral;  . FRONTAL SINUS EXPLORATION Bilateral 08/30/2019   Procedure: FRONTAL SINUS EXPLORATION;  Surgeon: Beverly Gust, MD;  Location: [place];  Service: ENT;  Laterality: Bilateral;  . IMAGE GUIDED SINUS SURGERY N/A 08/30/2019   Procedure: IMAGE GUIDED SINUS SURGERY;  Surgeon: Beverly Gust, MD;  Location: [place];  Service: ENT;  Laterality: N/A;  needs disk gave 2nd disk to Manuela Schwartz 1.13.21 ds Diabetic - oral meds  . MAXILLARY ANTROSTOMY Bilateral 08/30/2019   Procedure: MAXILLARY ANTROSTOMY WITH TISSUE REMOVAL;  Surgeon: Beverly Gust, MD;  Location: [place];  Service:  ENT;  Laterality: Bilateral;  . NASAL SEPTOPLASTY W/ TURBINOPLASTY Bilateral 08/30/2019   Procedure: NASAL SEPTOPLASTY WITH TURBINATE REDUCTION;  Surgeon: Beverly Gust, MD;  Location: [place];  Service: ENT;  Laterality: Bilateral;     OB History   No obstetric history on file.     Family History  Problem Relation Age of Onset  . Breast cancer Other     Social History   Tobacco Use  . Smoking status: Current Every Day Smoker    Packs/day: 0.50    Years: 25.00    Pack years: 12.50  . Smokeless tobacco: Never Used  . Tobacco comment: quit 2012 after 25 yrs/0.5 PPD.  Restart 2017 - 4 cigs /day  Substance Use Topics  . Alcohol use: Not Currently  . Drug use: Never    Home Medications Prior to Admission medications   Medication Sig Start Date End Date Taking? Authorizing Provider  chlorthalidone (HYGROTON) 25 MG tablet Take 25 mg by mouth daily.    [provider]  diclofenac Sodium (VOLTAREN) 1 % GEL Apply 2 g topically 4 (four) times daily for 13 days. 11/17/19 11/30/19  Brianny Soulliere S, PA-C  fluticasone (FLONASE) 50 MCG/ACT nasal spray Place into both nostrils daily as needed for allergies or rhinitis.    [provider]  HYDROcodone-Acetaminophen 5-300 MG TABS Take 1-2 tablets by mouth every 4 (four) hours as needed. 08/30/19   Beverly Gust, MD  Lemborexant (DAYVIGO) 5  MG TABS Take by mouth at bedtime as needed.    [provider]  losartan (COZAAR) 100 MG tablet Take 100 mg by mouth daily.    [provider]  metFORMIN (GLUCOPHAGE) 500 MG tablet Take 1 tablet (500 mg total) by mouth 2 (two) times daily with a meal. 06/25/19 08/21/19  Kayleen Memos, DO  Multiple Vitamin (MULTIVITAMIN) tablet Take 1 tablet by mouth daily.    [provider]  Probiotic Product (PROBIOTIC PO) Take by mouth daily.    [provider]  rosuvastatin (CRESTOR) 5 MG tablet Take 5 mg by mouth daily.    [provider]   sertraline (ZOLOFT) 100 MG tablet Take 100 mg by mouth daily.    [provider]  Vitamin D3 (VITAMIN D) 25 MCG tablet Take 1,000 Units by mouth daily.    [provider]    Allergies    Penicillins and Sulfa antibiotics  Review of Systems   Review of Systems  Musculoskeletal: Negative for neck pain.       LUE pain  Skin: Negative for color change and wound.  Neurological: Negative for weakness and numbness.    Physical Exam Updated Vital Signs BP 133/71 (BP Location: Right Arm)   Pulse 79   Temp 99.1 F (37.3 C) (Oral)   Resp 18   Ht 5\' 5"  (1.651 m)   Wt 82.6 kg   SpO2 99%   BMI 30.29 kg/m   Physical Exam Vitals and nursing note reviewed.  Constitutional:      General: She is not in acute distress.    Appearance: She is well-developed.  HENT:     Head: Normocephalic and atraumatic.  Eyes:     Conjunctiva/sclera: Conjunctivae normal.  Cardiovascular:     Rate and Rhythm: Normal rate.  Pulmonary:     Effort: Pulmonary effort is normal.  Musculoskeletal:        General: Normal range of motion.     Cervical back: Neck supple.     Comments: TTP the medial and lateral epicondyle. TTP also noted to to the muscles of the forearm. Radial/unlar pulses intact. Normal sensation. Strength is grossly intact to the BUE with exception of slightly decreased strength with elbow extension 2/2 pain.  There is no erythema, warmth or swelling to the joint.  She does not have any restricted range of motion of the joint.  Tinel's testing of the ulnar and radial nerve do not reproduce pain.  Skin:    General: Skin is warm and dry.  Neurological:     Mental Status: She is alert.     ED Results / Procedures / Treatments   Labs (all labs ordered are listed, but only abnormal results are displayed) Labs Reviewed - No data to display  EKG None  Radiology DG Elbow Complete Left  Result Date: 11/17/2019 CLINICAL DATA:  Left elbow pain EXAM: LEFT ELBOW - COMPLETE 3+  VIEW COMPARISON:  None. FINDINGS: There is no evidence of fracture, dislocation, or joint effusion. There is no evidence of arthropathy or other focal bone abnormality. Soft tissues are unremarkable. IMPRESSION: Negative. Electronically Signed   By: Davina Poke D.O.   On: 11/17/2019 13:58    Procedures Procedures (including critical care time)  Medications Ordered in ED Medications  ketorolac (TORADOL) 30 MG/ML injection 30 mg (30 mg Intramuscular Given 11/17/19 1234)    ED Course  I have reviewed the triage vital signs and the nursing notes.  Pertinent labs & imaging  results that were available during my care of the patient were reviewed by me and considered in my medical decision making (see chart for details).    MDM Rules/Calculators/A&P                      59 year old female presenting for evaluation of atraumatic left elbow pain which radiates to the forearm.  This started 4 days ago.  She denies any known trauma or injury.  Exam does not reveal any evidence of septic arthritis.  Vital signs are within normal limits   X-ray of the left elbow reviewed/interpreted - neg for fx  Patient given anti-inflammatory in the ED and on reassessment she is still having some pain but she have no restrictions in range of motion and is in no obvious distress. I suspect she may have an overuse injury. States she has had to take the garbage out and lift some heavy objects in her home. Will given antiinflammatory gel for home. Will give ortho f/u. Advised on return precautions. She voiced understanding of the plan and reasons to return. All questions answered, pt stable for d/c.   Final Clinical Impression(s) / ED Diagnoses Final diagnoses:  Left elbow pain    Rx / DC Orders ED Discharge Orders         Ordered    diclofenac Sodium (VOLTAREN) 1 % GEL  4 times daily     11/17/19 1511           Adrick Kestler S, PA-C 11/17/19 1511    Malon Branton S, PA-C 11/17/19 1516     Fredia Sorrow, MD 11/20/19 1717

## 2019-11-17 NOTE — Discharge Instructions (Addendum)
Use the diclofenac gel as directed.   Please call Dr. Ruthe Mannan office to schedule an appointment for follow up.  Please return to the emergency department for any new or worsening symptoms.

## 2019-11-17 NOTE — ED Triage Notes (Signed)
4 day hx of L arm pain   Reports she cannot lift anything with arm   Has FROM of L arm as she pulls mask down for temp   Denies known injury Pain to elbow area

## 2020-01-23 ENCOUNTER — Other Ambulatory Visit: Payer: Self-pay

## 2020-01-23 ENCOUNTER — Encounter (HOSPITAL_COMMUNITY): Payer: Self-pay | Admitting: Emergency Medicine

## 2020-01-23 ENCOUNTER — Emergency Department (HOSPITAL_COMMUNITY)
Admission: EM | Admit: 2020-01-23 | Discharge: 2020-01-23 | Disposition: A | Discharge status: Home / Self Care | Payer: 59 | Attending: Emergency Medicine | Admitting: Emergency Medicine

## 2020-01-23 ENCOUNTER — Ambulatory Visit (INDEPENDENT_AMBULATORY_CARE_PROVIDER_SITE_OTHER)
Admission: EM | Admit: 2020-01-23 | Discharge: 2020-01-23 | Disposition: A | Discharge status: Home / Self Care | Payer: 59 | Source: Home / Self Care

## 2020-01-23 ENCOUNTER — Emergency Department (HOSPITAL_COMMUNITY): Payer: 59

## 2020-01-23 DIAGNOSIS — M542 Cervicalgia: Secondary | ICD-10-CM

## 2020-01-23 DIAGNOSIS — I10 Essential (primary) hypertension: Secondary | ICD-10-CM | POA: Diagnosis not present

## 2020-01-23 DIAGNOSIS — R509 Fever, unspecified: Secondary | ICD-10-CM | POA: Diagnosis not present

## 2020-01-23 DIAGNOSIS — R5383 Other fatigue: Secondary | ICD-10-CM | POA: Diagnosis not present

## 2020-01-23 DIAGNOSIS — E119 Type 2 diabetes mellitus without complications: Secondary | ICD-10-CM | POA: Insufficient documentation

## 2020-01-23 DIAGNOSIS — R1310 Dysphagia, unspecified: Secondary | ICD-10-CM

## 2020-01-23 DIAGNOSIS — Z7984 Long term (current) use of oral hypoglycemic drugs: Secondary | ICD-10-CM | POA: Diagnosis not present

## 2020-01-23 DIAGNOSIS — Z79899 Other long term (current) drug therapy: Secondary | ICD-10-CM | POA: Diagnosis not present

## 2020-01-23 DIAGNOSIS — Z7982 Long term (current) use of aspirin: Secondary | ICD-10-CM | POA: Insufficient documentation

## 2020-01-23 DIAGNOSIS — R220 Localized swelling, mass and lump, head: Secondary | ICD-10-CM | POA: Diagnosis present

## 2020-01-23 DIAGNOSIS — Z880 Allergy status to penicillin: Secondary | ICD-10-CM | POA: Diagnosis not present

## 2020-01-23 DIAGNOSIS — R221 Localized swelling, mass and lump, neck: Secondary | ICD-10-CM | POA: Diagnosis not present

## 2020-01-23 DIAGNOSIS — Z882 Allergy status to sulfonamides status: Secondary | ICD-10-CM | POA: Diagnosis not present

## 2020-01-23 DIAGNOSIS — F17210 Nicotine dependence, cigarettes, uncomplicated: Secondary | ICD-10-CM | POA: Insufficient documentation

## 2020-01-23 DIAGNOSIS — L03211 Cellulitis of face: Secondary | ICD-10-CM | POA: Diagnosis not present

## 2020-01-23 DIAGNOSIS — R6883 Chills (without fever): Secondary | ICD-10-CM

## 2020-01-23 DIAGNOSIS — R52 Pain, unspecified: Secondary | ICD-10-CM

## 2020-01-23 LAB — CBC WITH DIFFERENTIAL/PLATELET
Abs Immature Granulocytes: 0.08 10*3/uL — ABNORMAL HIGH (ref 0.00–0.07)
Basophils Absolute: 0 10*3/uL (ref 0.0–0.1)
Basophils Relative: 0 %
Eosinophils Absolute: 0 10*3/uL (ref 0.0–0.5)
Eosinophils Relative: 0 %
HCT: 36 % (ref 36.0–46.0)
Hemoglobin: 12 g/dL (ref 12.0–15.0)
Immature Granulocytes: 1 %
Lymphocytes Relative: 8 %
Lymphs Abs: 1 10*3/uL (ref 0.7–4.0)
MCH: 31.6 pg (ref 26.0–34.0)
MCHC: 33.3 g/dL (ref 30.0–36.0)
MCV: 94.7 fL (ref 80.0–100.0)
Monocytes Absolute: 0.4 10*3/uL (ref 0.1–1.0)
Monocytes Relative: 3 %
Neutro Abs: 11.6 10*3/uL — ABNORMAL HIGH (ref 1.7–7.7)
Neutrophils Relative %: 88 %
Platelets: 244 10*3/uL (ref 150–400)
RBC: 3.8 MIL/uL — ABNORMAL LOW (ref 3.87–5.11)
RDW: 12.9 % (ref 11.5–15.5)
WBC: 13.2 10*3/uL — ABNORMAL HIGH (ref 4.0–10.5)
nRBC: 0 % (ref 0.0–0.2)

## 2020-01-23 LAB — COMPREHENSIVE METABOLIC PANEL
ALT: 20 U/L (ref 0–44)
AST: 19 U/L (ref 15–41)
Albumin: 3.9 g/dL (ref 3.5–5.0)
Alkaline Phosphatase: 70 U/L (ref 38–126)
Anion gap: 11 (ref 5–15)
BUN: 13 mg/dL (ref 6–20)
CO2: 24 mmol/L (ref 22–32)
Calcium: 8.6 mg/dL — ABNORMAL LOW (ref 8.9–10.3)
Chloride: 100 mmol/L (ref 98–111)
Creatinine, Ser: 0.69 mg/dL (ref 0.44–1.00)
GFR calc Af Amer: >60 mL/min (ref >60)
GFR calc non Af Amer: >60 mL/min (ref >60)
Glucose, Bld: 148 mg/dL — ABNORMAL HIGH (ref 70–99)
Potassium: 3 mmol/L — ABNORMAL LOW (ref 3.5–5.1)
Sodium: 135 mmol/L (ref 135–145)
Total Bilirubin: 1 mg/dL (ref 0.3–1.2)
Total Protein: 7.2 g/dL (ref 6.5–8.1)

## 2020-01-23 LAB — APTT: aPTT: 28 seconds (ref 24–36)

## 2020-01-23 LAB — PROTIME-INR
INR: 1 (ref 0.8–1.2)
Prothrombin Time: 12.7 seconds (ref 11.4–15.2)

## 2020-01-23 LAB — LACTIC ACID, PLASMA: Lactic Acid, Venous: 1.2 mmol/L (ref 0.5–1.9)

## 2020-01-23 MED ORDER — METHYLPREDNISOLONE SODIUM SUCC 125 MG IJ SOLR
125.0000 mg | Freq: Once | INTRAMUSCULAR | Status: AC
  Administered 2020-01-23: 125 mg via INTRAVENOUS

## 2020-01-23 MED ORDER — ACETAMINOPHEN 325 MG PO TABS: 975.0000 mg | ORAL_TABLET | Freq: Once | ORAL | Status: DC

## 2020-01-23 MED ORDER — CLINDAMYCIN HCL 150 MG PO CAPS: 450.0000 mg | ORAL_CAPSULE | Freq: Three times a day (TID) | ORAL | 0 refills | Status: AC

## 2020-01-23 MED ORDER — CLINDAMYCIN HCL 150 MG PO CAPS
150.0000 mg | ORAL_CAPSULE | Freq: Once | ORAL | Status: AC
  Administered 2020-01-23: 150 mg via ORAL
  Filled 2020-01-23: qty 1

## 2020-01-23 MED ORDER — CEFTRIAXONE SODIUM 500 MG IJ SOLR
500.0000 mg | Freq: Once | INTRAMUSCULAR | Status: AC
  Administered 2020-01-23: 500 mg via INTRAMUSCULAR

## 2020-01-23 MED ORDER — SODIUM CHLORIDE 0.9 % IV BOLUS
1000.0000 mL | Freq: Once | INTRAVENOUS | Status: AC
  Administered 2020-01-23: 1000 mL via INTRAVENOUS

## 2020-01-23 MED ORDER — METHYLPREDNISOLONE SODIUM SUCC 125 MG IJ SOLR: 125.0000 mg | Freq: Once | INTRAMUSCULAR | Status: DC

## 2020-01-23 MED ORDER — IOHEXOL 300 MG/ML  SOLN
75.0000 mL | Freq: Once | INTRAMUSCULAR | Status: AC | PRN
  Administered 2020-01-23: 75 mL via INTRAVENOUS

## 2020-01-23 MED ORDER — METHYLPREDNISOLONE SODIUM SUCC 125 MG IJ SOLR: 125.0000 mg | Freq: Every day | INTRAMUSCULAR | Status: DC

## 2020-01-23 MED ORDER — KETOROLAC TROMETHAMINE 30 MG/ML IJ SOLN
30.0000 mg | Freq: Once | INTRAMUSCULAR | Status: AC
  Administered 2020-01-23: 30 mg via INTRAVENOUS

## 2020-01-23 NOTE — ED Triage Notes (Signed)
Pt presents with co facial pain and swelling. Pt post sinus surgery

## 2020-01-23 NOTE — Discharge Instructions (Addendum)
Please take antibiotics, as prescribed.  You may also take NSAIDs such as ibuprofen or naproxen as needed for symptoms of inflammation and discomfort.  You will need to follow-up with your primary care provider regarding today's encounter and for ongoing evaluation and management.  Given your lymphadenopathy that you state is chronic, you may benefit from further evaluation of your inflamed lymph nodes including ultrasound and possible fine-needle aspiration.  They are likely reactive secondary to your infection, however given the chronicity, there should be further work-up to exclude malignancy.  Most importantly, you will need to contact a dentist as soon as possible to schedule appointment for further work-up and possible extraction of your affected teeth.  I have provided a list of dental resources.  If they do do not work for you, please look up available dental resources here in [place], [place] area.  Return to the ED or seek immediate medical attention should you experience any new or worsening symptoms.

## 2020-01-23 NOTE — ED Provider Notes (Signed)
[facility] EMERGENCY DEPARTMENT Provider Note   CSN: [id number] Arrival date & time: 01/23/20  1814     History Chief Complaint  Patient presents with  . Facial Swelling    Nancy Campos is a 59 y.o. female with PMH of HTN, HLD, DM, poor dentition, and maxillofacial surgery on her sinuses who presents to the ED from urgent care via EMS for right-sided facial swelling with extension down her neck.  Patient was evaluated by an urgent care provider who was concerned about her swelling and was given 125 mg IV Solu-Medrol, 30 mg IV Toradol, 500 mg Rocephin IM prior to arrival.  On my examination, patient is resting comfortably and not appearing to be in any acute distress.  She does have swelling involving her right maxillary mandibular area with tenderness appreciated along right side of neck, however no significant neck swelling.  Patient endorses low-grade fevers the past 24 hours and that she has been feeling weak.  She denies any wheezing, stridor, difficulty breathing, nausea or vomiting, cough, sore throat, precipitating injury or trauma, recent dental pain, or other symptoms.  HPI     Past Medical History:  Diagnosis Date  . Diabetes mellitus without complication ([place])   . Headache    sinus  . Hypercholesteremia   . Hypertension   . Wears dentures    partial upper    Patient Active Problem List   Diagnosis Date Noted  . Streptococcal pharyngitis 06/21/2019    Past Surgical History:  Procedure Laterality Date  . ABDOMINAL HYSTERECTOMY  1997  . BREAST BIOPSY Right 2013   benign  . BREAST EXCISIONAL BIOPSY Right 2007   benign  . ETHMOIDECTOMY Bilateral 08/30/2019   Procedure: ETHMOIDECTOMY;  Surgeon: Beverly Gust, MD;  Location: [place];  Service: ENT;  Laterality: Bilateral;  . FRONTAL SINUS EXPLORATION Bilateral 08/30/2019   Procedure: FRONTAL SINUS EXPLORATION;  Surgeon: Beverly Gust, MD;  Location: [place];  Service: ENT;  Laterality:  Bilateral;  . IMAGE GUIDED SINUS SURGERY N/A 08/30/2019   Procedure: IMAGE GUIDED SINUS SURGERY;  Surgeon: Beverly Gust, MD;  Location: [place];  Service: ENT;  Laterality: N/A;  needs disk gave 2nd disk to Manuela Schwartz 1.13.21 ds Diabetic - oral meds  . MAXILLARY ANTROSTOMY Bilateral 08/30/2019   Procedure: MAXILLARY ANTROSTOMY WITH TISSUE REMOVAL;  Surgeon: Beverly Gust, MD;  Location: [place];  Service: ENT;  Laterality: Bilateral;  . NASAL SEPTOPLASTY W/ TURBINOPLASTY Bilateral 08/30/2019   Procedure: NASAL SEPTOPLASTY WITH TURBINATE REDUCTION;  Surgeon: Beverly Gust, MD;  Location: [place];  Service: ENT;  Laterality: Bilateral;     OB History   No obstetric history on file.     Family History  Problem Relation Age of Onset  . Breast cancer Other     Social History   Tobacco Use  . Smoking status: Current Every Day Smoker    Packs/day: 0.50    Years: 25.00    Pack years: 12.50  . Smokeless tobacco: Never Used  . Tobacco comment: quit 2012 after 25 yrs/0.5 PPD.  Restart 2017 - 4 cigs /day  Vaping Use  . Vaping Use: Never used  Substance Use Topics  . Alcohol use: Not Currently  . Drug use: Never    Home Medications Prior to Admission medications   Medication Sig Start Date End Date Taking? Authorizing Provider  aspirin EC 81 MG tablet Take 81 mg by mouth in the morning. Swallow whole.   Yes [provider]  chlorthalidone (HYGROTON) 25 MG tablet Take 25 mg by mouth daily.   Yes [provider]  Clobetasol Propionate 0.05 % lotion Apply 1 application topically daily as needed. To elbos/affected areas for psoriasis 10/24/19  Yes [provider]  fluticasone (FLONASE) 50 MCG/ACT nasal spray Place 2 sprays into both nostrils at bedtime.    Yes [provider]  Lemborexant (DAYVIGO) 5 MG TABS Take 5 mg by mouth at bedtime.    Yes [provider]  losartan (COZAAR) 100 MG tablet Take 100 mg by  mouth daily.   Yes [provider]  metFORMIN (GLUCOPHAGE) 500 MG tablet Take 1 tablet (500 mg total) by mouth 2 (two) times daily with a meal. 06/25/19 01/23/20 Yes Kayleen Memos, DO  Multiple Vitamin (MULTIVITAMIN) tablet Take 1 tablet by mouth in the morning.    Yes [provider]  Polyethyl Glycol-Propyl Glycol (SYSTANE) 0.4-0.3 % SOLN Apply 1 drop to eye daily as needed (for dry eye relief).   Yes [provider]  rosuvastatin (CRESTOR) 5 MG tablet Take 5 mg by mouth at bedtime.    Yes [provider]  sertraline (ZOLOFT) 100 MG tablet Take 100 mg by mouth in the morning.    Yes [provider]  Vitamin D3 (VITAMIN D) 25 MCG tablet Take 1,000 Units by mouth in the morning.    Yes [provider]  clindamycin (CLEOCIN) 150 MG capsule Take 3 capsules (450 mg total) by mouth 3 (three) times daily for 7 days. 01/23/20 01/30/20  Corena Herter, PA-C    Allergies    Penicillins and Sulfa antibiotics  Review of Systems   Review of Systems  All other systems reviewed and are negative.   Physical Exam Updated Vital Signs BP (!) 121/100   Pulse 70   Temp 98.9 F (37.2 C) (Oral)   Resp 13   Ht 5\' 6"  (1.676 m)   Wt 81.6 kg   SpO2 98%   BMI 29.05 kg/m   Physical Exam Vitals and nursing note reviewed. Exam conducted with a chaperone present.  Constitutional:      Appearance: She is ill-appearing.  HENT:     Head: Normocephalic and atraumatic.     Mouth/Throat:     Comments: Patent oropharynx.  No tonsillar hypertrophy or uvular deviation.  No tongue swelling or floor of mouth induration.  Hard palate rises symmetrically.  Tolerating secretions well.  No trismus.  Poor dentition throughout.  Tenderness on right-sided maxillary region.  No overt abscesses appreciated.  Erythematous at base of #4 tooth. Eyes:     General: No scleral icterus.    Conjunctiva/sclera: Conjunctivae normal.  Cardiovascular:     Rate and Rhythm: Normal  rate and regular rhythm.     Pulses: Normal pulses.     Heart sounds: Normal heart sounds.  Pulmonary:     Comments: No increased respiratory effort.  No respiratory distress.  Breath sounds intact bilaterally.  Symmetric chest rise.  No stridor or wheezing appreciated. Skin:    General: Skin is dry.  Neurological:     Mental Status: She is alert.     GCS: GCS eye subscore is 4. GCS verbal subscore is 5. GCS motor subscore is 6.  Psychiatric:        Mood and Affect: Mood normal.        Behavior: Behavior normal.        Thought Content: Thought content normal.     ED Results / Procedures /  Treatments   Labs (all labs ordered are listed, but only abnormal results are displayed) Labs Reviewed  COMPREHENSIVE METABOLIC PANEL - Abnormal; Notable for the following components:      Result Value   Potassium 3.0 (*)    Glucose, Bld 148 (*)    Calcium 8.6 (*)    All other components within normal limits  CBC WITH DIFFERENTIAL/PLATELET - Abnormal; Notable for the following components:   WBC 13.2 (*)    RBC 3.80 (*)    Neutro Abs 11.6 (*)    Abs Immature Granulocytes 0.08 (*)    All other components within normal limits  CULTURE, BLOOD (ROUTINE X 2)  CULTURE, BLOOD (ROUTINE X 2)  LACTIC ACID, PLASMA  APTT  PROTIME-INR    EKG None  Radiology CT Soft Tissue Neck W Contrast  Result Date: 01/23/2020 CLINICAL DATA:  Neck abscess.  Right-sided pain and swelling. EXAM: CT NECK WITH CONTRAST TECHNIQUE: Multidetector CT imaging of the neck was performed using the standard protocol following the bolus administration of intravenous contrast. CONTRAST:  57mL OMNIPAQUE IOHEXOL 300 MG/ML  SOLN COMPARISON:  None. FINDINGS: Pharynx and larynx: No mucosal or submucosal lesion. Salivary glands: Parotid and submandibular glands are normal. Thyroid: Normal Lymph nodes: Enlarged level 1, level 2 and level 3 lymph nodes bilaterally, right more than left, favored to be reactive to inflammatory changes.  However, neoplastic disease/lymphoma not absolutely excluded. Vascular: Atherosclerosis at the carotid bifurcations. Limited intracranial: Normal Visualized orbits: Normal Mastoids and visualized paranasal sinuses: Mucosal inflammatory changes at the floors of the maxillary sinuses. Likely odontogenic on the right. See below. Skeleton: Advanced dental and periodontal DKA at the right maxillary molar with involvement of the inferior sinus. Fracture of the roots. This is likely the site the soft tissue inflammatory disease. Upper chest: Chronic calcified lymph nodes. No acute lung disease in the upper chest. Other: Evidence of right facial cellulitis, likely due to the right maxillary dental disease. No sign of drainable abscess. 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 Right facial edematous changes consistent with facial cellulitis. No evidence of drainable soft tissue abscess. Likely reactive lymphadenopathy right more than left. No suppuration. The nodes are more likely to be reactive than neoplastic, though lymphoma not absolutely excluded given this pattern. Electronically Signed   By: Nelson Chimes M.D.   On: 01/23/2020 20:53    Procedures Procedures (including critical care time)  Medications Ordered in ED Medications  clindamycin (CLEOCIN) capsule 150 mg (has no administration in time range)  iohexol (OMNIPAQUE) 300 MG/ML solution 75 mL (75 mLs Intravenous Contrast Given 01/23/20 2033)    ED Course  I have reviewed the triage vital signs and the nursing notes.  Pertinent labs & imaging results that were available during my care of the patient were reviewed by me and considered in my medical decision making (see chart for details).    MDM Rules/Calculators/A&P                          Patient reports that she is  already feeling a bit improved after receiving steroids, Toradol, and Rocephin antibiotics prior to arrival.  She states that her swelling was more pronounced at the urgent care and that she was having difficulty opening her mouth.  My physical exam is relatively unremarkable.  No trismus, difficult during  secretions, voice change, tongue swelling, uvular deviation, or other concerning findings on exam.  She is breathing normally without any wheezing or stridor.  Low suspicion for throat closing.  She is tender on the right side of her neck, but suspect a cervical adenopathy.  She does endorse a history of poor dentition and there is some erythema around the base of her #4 tooth.  Suspect this could be odontogenic infection, but she states that she has not had any significant dental pain recently and notes a recent sinus surgery.  Will obtain soft tissue CT with contrast to assess for deep tissue abscess.  I personally reviewed her CT soft tissue neck with contrast which demonstrates advanced dental and periodontal disease affecting right maxillary region with odontogenic inflammatory changes involving the right maxillary sinus.  Erythematous changes are consistent with a facial cellulitis, but no obvious drainable abscess.  Given her reassuring physical exam, do not feel as though IV antibiotics for her facial cellulitis is warranted.  Instead, will discharge home with clindamycin x7 days and encouraged her to follow-up with her primary care provider regarding today's encounter.  More importantly, she needs to follow-up with a dentist as she has not been established since moving here a few years ago.  Patient does have lymphadenopathy that are likely reactive, however neoplastic etiology cannot be excluded.  She states that she has had these lymph nodes inflamed for months prior to onset of her symptoms here today and that her PCP had not thought much of it.  Encouraging her to discuss with them possible  ultrasound imaging +/-FNA should her lymphadenopathy fail to improve with her antibiotic management.  Strict ED return precautions discussed with the patient.  All of the evaluation and work-up results were discussed with the patient and any family at bedside. They were provided opportunity to ask any additional questions and have none at this time. They have expressed understanding of verbal discharge instructions as well as return precautions and are agreeable to the plan.    Final Clinical Impression(s) / ED Diagnoses Final diagnoses:  Facial cellulitis    Rx / DC Orders ED Discharge Orders         Ordered    clindamycin (CLEOCIN) 150 MG capsule  3 times daily     Discontinue  Reprint     01/23/20 2219           Corena Herter, PA-C 01/23/20 2244    Veryl Speak, MD 01/23/20 2326

## 2020-01-23 NOTE — ED Triage Notes (Signed)
PT brought by [facility] from Urgent care today for right sided facial swelling extending down into her neck that started today and having difficulty swallowing. IV established and pt was given 500mg  rocephin IM, 30mg  toradol IV and 125mg  of solumedrol IV prior to ED arrival.

## 2020-01-23 NOTE — Discharge Instructions (Addendum)
Go directly to the ER for further evaluation and treatment ° °

## 2020-01-23 NOTE — ED Notes (Signed)
Patient is being discharged from the Urgent Care and sent to the Emergency Department via EMS . Per Kirkland Hun , patient is in need of higher level of care due to potential for compromised airway . Patient is aware and verbalizes understanding of plan of care.  Vitals:   01/23/20 1651 01/23/20 1755  BP: 105/69   Pulse: 99   Resp: 17   Temp: 99.5 F (37.5 C) (!) 100.5 F (38.1 C)  SpO2: 97%

## 2020-01-23 NOTE — ED Notes (Signed)
Labs will not be sent out to [facility] due to pt going to ER

## 2020-01-23 NOTE — ED Provider Notes (Addendum)
[facility]    CSN: [id number] Arrival date & time: 01/23/20  1626      History   Chief Complaint Chief Complaint  Patient presents with  . Facial Pain    HPI Nancy Campos is a 59 y.o. female.   Reports that she has been having lymph node swelling to the right side of her neck and face. Reports fatigue, weakness, chills, fever for the last day. Reports that she took ibuprofen earlier today and then she was able to sleep. Reports that she had sinus surgery in January. Reports that she told her doctor that she was having these issues and was told that she was healing. There are no aggravating or alleviating symptoms. Denies SOB, headache, nausea, vomiting, diarrhea, rash, other symptoms.  ROS per HPI  The history is provided by the patient.    Past Medical History:  Diagnosis Date  . Diabetes mellitus without complication ([place])   . Headache    sinus  . Hypercholesteremia   . Hypertension   . Wears dentures    partial upper    Patient Active Problem List   Diagnosis Date Noted  . Streptococcal pharyngitis 06/21/2019    Past Surgical History:  Procedure Laterality Date  . ABDOMINAL HYSTERECTOMY  1997  . BREAST BIOPSY Right 2013   benign  . BREAST EXCISIONAL BIOPSY Right 2007   benign  . ETHMOIDECTOMY Bilateral 08/30/2019   Procedure: ETHMOIDECTOMY;  Surgeon: Beverly Gust, MD;  Location: [place];  Service: ENT;  Laterality: Bilateral;  . FRONTAL SINUS EXPLORATION Bilateral 08/30/2019   Procedure: FRONTAL SINUS EXPLORATION;  Surgeon: Beverly Gust, MD;  Location: [place];  Service: ENT;  Laterality: Bilateral;  . IMAGE GUIDED SINUS SURGERY N/A 08/30/2019   Procedure: IMAGE GUIDED SINUS SURGERY;  Surgeon: Beverly Gust, MD;  Location: [place];  Service: ENT;  Laterality: N/A;  needs disk gave 2nd disk to Manuela Schwartz 1.13.21 ds Diabetic - oral meds  . MAXILLARY ANTROSTOMY Bilateral 08/30/2019   Procedure: MAXILLARY  ANTROSTOMY WITH TISSUE REMOVAL;  Surgeon: Beverly Gust, MD;  Location: [place];  Service: ENT;  Laterality: Bilateral;  . NASAL SEPTOPLASTY W/ TURBINOPLASTY Bilateral 08/30/2019   Procedure: NASAL SEPTOPLASTY WITH TURBINATE REDUCTION;  Surgeon: Beverly Gust, MD;  Location: [place];  Service: ENT;  Laterality: Bilateral;    OB History   No obstetric history on file.      Home Medications    Prior to Admission medications   Medication Sig Start Date End Date Taking? Authorizing Provider  chlorthalidone (HYGROTON) 25 MG tablet Take 25 mg by mouth daily.    [provider]  fluticasone (FLONASE) 50 MCG/ACT nasal spray Place into both nostrils daily as needed for allergies or rhinitis.    [provider]  HYDROcodone-Acetaminophen 5-300 MG TABS Take 1-2 tablets by mouth every 4 (four) hours as needed. 08/30/19   Beverly Gust, MD  Lemborexant (DAYVIGO) 5 MG TABS Take by mouth at bedtime as needed.    [provider]  losartan (COZAAR) 100 MG tablet Take 100 mg by mouth daily.    [provider]  metFORMIN (GLUCOPHAGE) 500 MG tablet Take 1 tablet (500 mg total) by mouth 2 (two) times daily with a meal. 06/25/19 08/21/19  Kayleen Memos, DO  Multiple Vitamin (MULTIVITAMIN) tablet Take 1 tablet by mouth daily.    [provider]  Probiotic Product (PROBIOTIC PO) Take by mouth daily.    [provider]  rosuvastatin (CRESTOR)  5 MG tablet Take 5 mg by mouth daily.    [provider]  sertraline (ZOLOFT) 100 MG tablet Take 100 mg by mouth daily.    [provider]  Vitamin D3 (VITAMIN D) 25 MCG tablet Take 1,000 Units by mouth daily.    [provider]    Family History Family History  Problem Relation Age of Onset  . Breast cancer Other     Social History Social History   Tobacco Use  . Smoking status: Current Every Day Smoker    Packs/day: 0.50    Years: 25.00    Pack  years: 12.50  . Smokeless tobacco: Never Used  . Tobacco comment: quit 2012 after 25 yrs/0.5 PPD.  Restart 2017 - 4 cigs /day  Vaping Use  . Vaping Use: Never used  Substance Use Topics  . Alcohol use: Not Currently  . Drug use: Never     Allergies   Penicillins and Sulfa antibiotics   Review of Systems Review of Systems   Physical Exam Triage Vital Signs ED Triage Vitals  Enc Vitals Group     BP 01/23/20 1651 105/69     Pulse Rate 01/23/20 1651 99     Resp 01/23/20 1651 17     Temp 01/23/20 1651 99.5 F (37.5 C)     Temp Source 01/23/20 1651 Oral     SpO2 01/23/20 1651 97 %     Weight --      Height --      Head Circumference --      Peak Flow --      Pain Score 01/23/20 1657 6     Pain Loc --      Pain Edu? --      Excl. in [place]? --    No data found.  Updated Vital Signs BP 105/69 (BP Location: Right Arm)   Pulse 99   Temp (!) 100.5 F (38.1 C) (Tympanic) Comment: temp recheck  Resp 17   SpO2 97%    Physical Exam Vitals and nursing note reviewed.  Constitutional:      General: She is not in acute distress.    Appearance: She is well-developed. She is ill-appearing.  HENT:     Head: Normocephalic and atraumatic.  Eyes:     Conjunctiva/sclera: Conjunctivae normal.     Pupils: Pupils are equal, round, and reactive to light.  Cardiovascular:     Rate and Rhythm: Normal rate and regular rhythm.     Heart sounds: Normal heart sounds. No murmur heard.   Pulmonary:     Effort: Pulmonary effort is normal. No respiratory distress.     Breath sounds: Normal breath sounds.  Abdominal:     Palpations: Abdomen is soft.     Tenderness: There is no abdominal tenderness.  Musculoskeletal:        General: Normal range of motion.     Cervical back: Neck supple.  Lymphadenopathy:     Cervical: Cervical adenopathy (right side) present.  Skin:    General: Skin is warm and dry.     Capillary Refill: Capillary refill takes less than 2 seconds.  Neurological:      General: No focal deficit present.     Mental Status: She is alert and oriented to person, place, and time.  Psychiatric:        Mood and Affect: Mood normal.        Behavior: Behavior normal.      UC Treatments / Results  Labs (all labs ordered are listed, but only abnormal results are displayed) Labs Reviewed  CBC WITH DIFFERENTIAL/PLATELET  COMPREHENSIVE METABOLIC PANEL    EKG   Radiology No results found.  Procedures Procedures (including critical care time)  Medications Ordered in UC Medications  methylPREDNISolone sodium succinate (SOLU-MEDROL) 125 mg/2 mL injection 125 mg (has no administration in time range)  cefTRIAXone (ROCEPHIN) injection 500 mg (has no administration in time range)  sodium chloride 0.9 % bolus 1,000 mL (has no administration in time range)    Initial Impression / Assessment and Plan / UC Course  I have reviewed the triage vital signs and the nursing notes.  Pertinent labs & imaging results that were available during my care of the patient were reviewed by me and considered in my medical decision making (see chart for details).    Neck Pain Neck Swelling Fatigue Fever Aches Chills Trouble Swallowing  Gave Rocephin 500mg  IM in office today Gave Solumedrol 125mg  IV in office today  Gave Toradol 30mg  IV in office today NS bolus in office today Cannot swallow tylenol to help reduce fever During this visit, she has had an increasing temperature, increasing difficulty swallowing, increase in pain, discussed with patient that I think that she needs a more in depth work up in the ER Concerned that her airway may become compromised with the increase in swelling of her neck Verbalizes understanding and is agreeable to treatment plan EMS activated, report to them and transported to the ER via ambulance  Final Clinical Impressions(s) / UC Diagnoses   Final diagnoses:  Neck pain  Neck swelling  Other fatigue  Fever, unspecified fever cause   Aches  Chills  Dysphagia, unspecified type     Discharge Instructions     Go directly to the ER for further evaluation and treatment    ED Prescriptions    None     PDMP not reviewed this encounter.   Faustino Congress, NP 01/23/20 1803    Faustino Congress, NP 01/23/20 1805

## 2020-01-28 LAB — CULTURE, BLOOD (ROUTINE X 2)
Culture: NO GROWTH
Culture: NO GROWTH
Special Requests: ADEQUATE
Special Requests: ADEQUATE

## 2020-05-06 ENCOUNTER — Other Ambulatory Visit: Payer: Self-pay | Admitting: Nurse Practitioner

## 2020-05-06 DIAGNOSIS — M545 Low back pain, unspecified: Secondary | ICD-10-CM

## 2020-05-06 DIAGNOSIS — M47816 Spondylosis without myelopathy or radiculopathy, lumbar region: Secondary | ICD-10-CM

## 2020-05-15 ENCOUNTER — Other Ambulatory Visit: Payer: Self-pay | Admitting: Unknown Physician Specialty

## 2020-05-15 ENCOUNTER — Ambulatory Visit
Admission: RE | Admit: 2020-05-15 | Discharge: 2020-05-15 | Disposition: A | Discharge status: Home / Self Care | Payer: Self-pay | Source: Ambulatory Visit | Attending: Unknown Physician Specialty | Admitting: Unknown Physician Specialty

## 2020-05-15 DIAGNOSIS — I6521 Occlusion and stenosis of right carotid artery: Secondary | ICD-10-CM

## 2020-05-15 DIAGNOSIS — K118 Other diseases of salivary glands: Secondary | ICD-10-CM

## 2020-05-22 ENCOUNTER — Other Ambulatory Visit: Payer: Self-pay | Admitting: Radiology

## 2020-05-25 ENCOUNTER — Other Ambulatory Visit: Payer: Self-pay | Admitting: Student

## 2020-05-26 ENCOUNTER — Ambulatory Visit
Admission: RE | Admit: 2020-05-26 | Discharge: 2020-05-26 | Disposition: A | Discharge status: Home / Self Care | Payer: 59 | Source: Ambulatory Visit | Attending: Unknown Physician Specialty | Admitting: Unknown Physician Specialty

## 2020-05-26 ENCOUNTER — Other Ambulatory Visit: Payer: Self-pay

## 2020-05-26 DIAGNOSIS — K118 Other diseases of salivary glands: Secondary | ICD-10-CM | POA: Diagnosis not present

## 2020-05-26 LAB — GLUCOSE, CAPILLARY: Glucose-Capillary: 138 mg/dL — ABNORMAL HIGH (ref 70–99)

## 2020-05-26 NOTE — Discharge Instructions (Signed)

## 2020-05-26 NOTE — Procedures (Signed)
Pre Procedure Dx: Right parotid nodule Post Procedural Dx: Same  Technically successful US guided biopsy of indeterminate right parotid nodule.  EBL: None No immediate complications.   Ronny Bacon, MD Pager #: [phone]

## 2020-05-27 ENCOUNTER — Ambulatory Visit
Admission: RE | Admit: 2020-05-27 | Discharge: 2020-05-27 | Disposition: A | Discharge status: Home / Self Care | Payer: 59 | Source: Ambulatory Visit | Attending: Nurse Practitioner | Admitting: Nurse Practitioner

## 2020-05-27 DIAGNOSIS — M47816 Spondylosis without myelopathy or radiculopathy, lumbar region: Secondary | ICD-10-CM | POA: Insufficient documentation

## 2020-05-27 DIAGNOSIS — M545 Low back pain, unspecified: Secondary | ICD-10-CM | POA: Insufficient documentation

## 2020-05-27 LAB — SURGICAL PATHOLOGY

## 2020-06-25 ENCOUNTER — Encounter
Admission: RE | Admit: 2020-06-25 | Discharge: 2020-06-25 | Disposition: A | Discharge status: Home / Self Care | Payer: 59 | Source: Ambulatory Visit | Attending: Unknown Physician Specialty | Admitting: Unknown Physician Specialty

## 2020-06-25 HISTORY — DX: Family history of other specified conditions: Z84.89

## 2020-06-25 HISTORY — DX: Unspecified osteoarthritis, unspecified site: M19.90

## 2020-06-25 HISTORY — DX: Pneumonia, unspecified organism: J18.9

## 2020-06-25 NOTE — Patient Instructions (Addendum)
Your procedure is scheduled on: 07/07/20- Tuesday Report to the Registration Desk on the 1st floor of the [facility]. To find out your arrival time, please call [phone] between 1PM - 3PM on: 07/06/20- Monday  REMEMBER: Instructions that are not followed completely may result in serious medical risk, up to and including death; or upon the discretion of your surgeon and anesthesiologist your surgery may need to be rescheduled.  Do not eat food after midnight the night before surgery.  No gum chewing, lozengers or hard candies.  You may however, drink water up to 2 hours before you are scheduled to arrive for your surgery. Do not drink anything within 2 hours of your scheduled arrival time. Type 1 and Type 2 diabetics should only drink water.  TAKE THESE MEDICATIONS THE MORNING OF SURGERY WITH A SIP OF WATER:  - sertraline (ZOLOFT) 100 MG tablet  Stop Metformin 500 mg tablet  2 days prior to surgery. Do not take 11/28, 11/29 and do not take the morning of surgery.  Follow recommendations from Cardiologist, Pulmonologist or PCP regarding stopping Aspirin.   One week prior to surgery: Stop Anti-inflammatories (NSAIDS) such as Advil, Aleve, Ibuprofen, Motrin, Naproxen, Naprosyn and Aspirin based products such as Excedrin, Goodys Powder, BC Powder.  Stop ANY OVER THE COUNTER supplements until after surgery. (However, you may continue taking Vitamin D, Vitamin B, and multivitamin up until the day before surgery.)  No Alcohol for 24 hours before or after surgery.  No Smoking including e-cigarettes for 24 hours prior to surgery.  No chewable tobacco products for at least 6 hours prior to surgery.  No nicotine patches on the day of surgery.  Do not use any "recreational" drugs for at least a week prior to your surgery.  Please be advised that the combination of cocaine and anesthesia may have negative outcomes, up to and including death. If you test positive for cocaine, your surgery  will be cancelled.  On the morning of surgery brush your teeth with toothpaste and water, you may rinse your mouth with mouthwash if you wish. Do not swallow any toothpaste or mouthwash.  Do not wear jewelry, make-up, hairpins, clips or nail polish.  Do not wear lotions, powders, or perfumes.   Do not shave body from the neck down 48 hours prior to surgery just in case you cut yourself which could leave a site for infection.  Also, freshly shaved skin may become irritated if using the CHG soap.  Contact lenses, hearing aids and dentures may not be worn into surgery.  Do not bring valuables to the hospital. [facility] is not responsible for any missing/lost belongings or valuables.   Notify your doctor if there is any change in your medical condition (cold, fever, infection).  Wear comfortable clothing (specific to your surgery type) to the hospital.  Plan for stool softeners for home use; pain medications have a tendency to cause constipation. You can also help prevent constipation by eating foods high in fiber such as fruits and vegetables and drinking plenty of fluids as your diet allows.  After surgery, you can help prevent lung complications by doing breathing exercises.  Take deep breaths and cough every 1-2 hours. Your doctor may order a device called an Incentive Spirometer to help you take deep breaths. When coughing or sneezing, hold a pillow firmly against your incision with both hands. This is called "splinting." Doing this helps protect your incision. It also decreases belly discomfort.  If you are being  admitted to the hospital overnight, leave your suitcase in the car. After surgery it may be brought to your room.  If you are being discharged the day of surgery, you will not be allowed to drive home. You will need a responsible adult (18 years or older) to drive you home and stay with you that night.   If you are taking public transportation, you will need to have a  responsible adult (18 years or older) with you. Please confirm with your physician that it is acceptable to use public transportation.   Please call the [facility]. at [phone] if you have any questions about these instructions.  Visitation Policy:  Patients undergoing a surgery or procedure may have one family member or support person with them as long as that person is not COVID-19 positive or experiencing its symptoms.  That person may remain in the waiting area during the procedure.  Inpatient Visitation Update:   In an effort to ensure the safety of our team members and our patients, we are implementing a change to our visitation policy:  Effective Monday, Aug. 9, at 7 a.m., inpatients will be allowed one support person.  o The support person may change daily.  o The support person must pass our screening, gel in and out, and wear a mask at all times, including in the patient's room.  o Patients must also wear a mask when staff or their support person are in the room.  o Masking is required regardless of vaccination status.  Systemwide, no visitors 17 or younger.

## 2020-07-06 ENCOUNTER — Other Ambulatory Visit
Admission: RE | Admit: 2020-07-06 | Discharge: 2020-07-06 | Disposition: A | Discharge status: Home / Self Care | Payer: 59 | Source: Ambulatory Visit | Attending: Unknown Physician Specialty | Admitting: Unknown Physician Specialty

## 2020-07-06 DIAGNOSIS — Z20822 Contact with and (suspected) exposure to covid-19: Secondary | ICD-10-CM | POA: Insufficient documentation

## 2020-07-06 DIAGNOSIS — Z01812 Encounter for preprocedural laboratory examination: Secondary | ICD-10-CM | POA: Diagnosis present

## 2020-07-06 DIAGNOSIS — R7989 Other specified abnormal findings of blood chemistry: Secondary | ICD-10-CM | POA: Insufficient documentation

## 2020-07-06 LAB — BASIC METABOLIC PANEL
Anion gap: 10 (ref 5–15)
BUN: 13 mg/dL (ref 6–20)
CO2: 26 mmol/L (ref 22–32)
Calcium: 8.9 mg/dL (ref 8.9–10.3)
Chloride: 100 mmol/L (ref 98–111)
Creatinine, Ser: 0.71 mg/dL (ref 0.44–1.00)
GFR, Estimated: >60 mL/min (ref >60)
Glucose, Bld: 245 mg/dL — ABNORMAL HIGH (ref 70–99)
Potassium: 2.9 mmol/L — ABNORMAL LOW (ref 3.5–5.1)
Sodium: 136 mmol/L (ref 135–145)

## 2020-07-06 LAB — CBC
HCT: 37.5 % (ref 36.0–46.0)
Hemoglobin: 12.6 g/dL (ref 12.0–15.0)
MCH: 31.7 pg (ref 26.0–34.0)
MCHC: 33.6 g/dL (ref 30.0–36.0)
MCV: 94.5 fL (ref 80.0–100.0)
Platelets: 223 10*3/uL (ref 150–400)
RBC: 3.97 MIL/uL (ref 3.87–5.11)
RDW: 12.4 % (ref 11.5–15.5)
WBC: 7.7 10*3/uL (ref 4.0–10.5)
nRBC: 0 % (ref 0.0–0.2)

## 2020-07-06 LAB — SARS CORONAVIRUS 2 (TAT 6-24 HRS): SARS Coronavirus 2: NEGATIVE

## 2020-07-06 MED ORDER — SODIUM CHLORIDE 0.9 % IV SOLN: INTRAVENOUS | Status: DC

## 2020-07-06 MED ORDER — CHLORHEXIDINE GLUCONATE 0.12 % MT SOLN: 15.0000 mL | Freq: Once | OROMUCOSAL | Status: AC

## 2020-07-06 MED ORDER — ORAL CARE MOUTH RINSE: 15.0000 mL | Freq: Once | OROMUCOSAL | Status: AC

## 2020-07-06 MED ORDER — FAMOTIDINE 20 MG PO TABS
20.0000 mg | ORAL_TABLET | Freq: Once | ORAL | Status: AC
  Administered 2020-07-07: 20 mg via ORAL

## 2020-07-06 NOTE — Progress Notes (Signed)
  [facility] Perioperative Services: Pre-Admission/Anesthesia Testing  Abnormal Lab Notification   Date: 07/06/20  Name: Nancy Campos MRN:   [id number]  Re: Abnormal labs noted during PAT appointment   Provider(s) Notified: Beverly Gust, MD Notification mode: Routed and/or faxed via [facility]   ABNORMAL LAB VALUE(S): Lab Results  Component Value Date   K 2.9 (L) 07/06/2020   Notes:  Patient is scheduled for a PAROTIDECTOMY (Right ) on 07/07/2020. Patient is on daily chlorthalidone daily. She takes daily potassium 99 mg supplement; has been on Klor-Con 10 mEq in the past. Will send to surgeon for review and optimization. Order placed for K+ to be rechecked prior to surgery.   This is a Community education officer; no formal response is required.  Honor Loh, MSN, APRN, FNP-C, CEN [facility]  Peri-operative Services Nurse Practitioner Phone: [phone] Fax: [phone] 07/06/20 1:05 PM

## 2020-07-07 ENCOUNTER — Encounter: Payer: Self-pay | Admitting: Unknown Physician Specialty

## 2020-07-07 ENCOUNTER — Ambulatory Visit: Payer: 59 | Admitting: Urgent Care

## 2020-07-07 ENCOUNTER — Other Ambulatory Visit: Payer: Self-pay

## 2020-07-07 ENCOUNTER — Encounter
Admission: RE | Disposition: A | Discharge status: Home / Self Care | Payer: Self-pay | Source: Home / Self Care | Attending: Unknown Physician Specialty

## 2020-07-07 ENCOUNTER — Ambulatory Visit
Admission: RE | Admit: 2020-07-07 | Discharge: 2020-07-07 | Disposition: A | Discharge status: Home / Self Care | Payer: 59 | Attending: Unknown Physician Specialty | Admitting: Unknown Physician Specialty

## 2020-07-07 DIAGNOSIS — F17210 Nicotine dependence, cigarettes, uncomplicated: Secondary | ICD-10-CM | POA: Diagnosis not present

## 2020-07-07 DIAGNOSIS — Z79899 Other long term (current) drug therapy: Secondary | ICD-10-CM | POA: Diagnosis not present

## 2020-07-07 DIAGNOSIS — Z880 Allergy status to penicillin: Secondary | ICD-10-CM | POA: Insufficient documentation

## 2020-07-07 DIAGNOSIS — Z7984 Long term (current) use of oral hypoglycemic drugs: Secondary | ICD-10-CM | POA: Insufficient documentation

## 2020-07-07 DIAGNOSIS — D110 Benign neoplasm of parotid gland: Secondary | ICD-10-CM | POA: Insufficient documentation

## 2020-07-07 DIAGNOSIS — Z882 Allergy status to sulfonamides status: Secondary | ICD-10-CM | POA: Diagnosis not present

## 2020-07-07 DIAGNOSIS — D37030 Neoplasm of uncertain behavior of the parotid salivary glands: Secondary | ICD-10-CM | POA: Diagnosis present

## 2020-07-07 HISTORY — PX: PAROTIDECTOMY: SHX2163

## 2020-07-07 LAB — POCT I-STAT, CHEM 8
BUN: 12 mg/dL (ref 6–20)
Calcium, Ion: 1.15 mmol/L (ref 1.15–1.40)
Chloride: 100 mmol/L (ref 98–111)
Creatinine, Ser: 0.6 mg/dL (ref 0.44–1.00)
Glucose, Bld: 189 mg/dL — ABNORMAL HIGH (ref 70–99)
HCT: 40 % (ref 36.0–46.0)
Hemoglobin: 13.6 g/dL (ref 12.0–15.0)
Potassium: 3 mmol/L — ABNORMAL LOW (ref 3.5–5.1)
Sodium: 137 mmol/L (ref 135–145)
TCO2: 23 mmol/L (ref 22–32)

## 2020-07-07 LAB — GLUCOSE, CAPILLARY
Glucose-Capillary: 180 mg/dL — ABNORMAL HIGH (ref 70–99)
Glucose-Capillary: 172 mg/dL — ABNORMAL HIGH (ref 70–99)

## 2020-07-07 SURGERY — EXCISION, PAROTID GLAND
Anesthesia: General | Laterality: Right

## 2020-07-07 MED ORDER — HYDROMORPHONE HCL 1 MG/ML IJ SOLN
0.2500 mg | INTRAMUSCULAR | Status: DC | PRN
  Administered 2020-07-07 (×2): 0.25 mg via INTRAVENOUS
  Administered 2020-07-07 (×2): 0.5 mg via INTRAVENOUS

## 2020-07-07 MED ORDER — FAMOTIDINE 20 MG PO TABS
ORAL_TABLET | ORAL | Status: AC
  Filled 2020-07-07: qty 1

## 2020-07-07 MED ORDER — ONDANSETRON HCL 4 MG/2ML IJ SOLN
INTRAMUSCULAR | Status: DC | PRN
  Administered 2020-07-07: 4 mg via INTRAVENOUS

## 2020-07-07 MED ORDER — HYDROCODONE-ACETAMINOPHEN 5-300 MG PO TABS
1.0000 | ORAL_TABLET | ORAL | 0 refills | Status: AC | PRN
Start: 2020-07-07 — End: ?

## 2020-07-07 MED ORDER — HYDROMORPHONE HCL 1 MG/ML IJ SOLN
INTRAMUSCULAR | Status: AC
  Administered 2020-07-07: 0.25 mg via INTRAVENOUS
  Filled 2020-07-07: qty 1

## 2020-07-07 MED ORDER — ONDANSETRON HCL 4 MG/2ML IJ SOLN
INTRAMUSCULAR | Status: AC
  Filled 2020-07-07: qty 2

## 2020-07-07 MED ORDER — EPHEDRINE SULFATE 50 MG/ML IJ SOLN
INTRAMUSCULAR | Status: DC | PRN
  Administered 2020-07-07 (×2): 10 mg via INTRAVENOUS
  Administered 2020-07-07: 15 mg via INTRAVENOUS
  Administered 2020-07-07: 10 mg via INTRAVENOUS

## 2020-07-07 MED ORDER — OXYCODONE HCL 5 MG/5ML PO SOLN
ORAL | Status: AC
  Filled 2020-07-07: qty 5

## 2020-07-07 MED ORDER — DROPERIDOL 2.5 MG/ML IJ SOLN
0.6250 mg | Freq: Once | INTRAMUSCULAR | Status: DC | PRN
  Filled 2020-07-07: qty 2

## 2020-07-07 MED ORDER — ACETAMINOPHEN 10 MG/ML IV SOLN
INTRAVENOUS | Status: DC | PRN
  Administered 2020-07-07: 1000 mg via INTRAVENOUS

## 2020-07-07 MED ORDER — DEXAMETHASONE SODIUM PHOSPHATE 10 MG/ML IJ SOLN
INTRAMUSCULAR | Status: AC
  Filled 2020-07-07: qty 1

## 2020-07-07 MED ORDER — OXYCODONE HCL 5 MG/5ML PO SOLN
5.0000 mg | Freq: Once | ORAL | Status: AC | PRN
  Administered 2020-07-07: 5 mg via ORAL

## 2020-07-07 MED ORDER — MIDAZOLAM HCL 2 MG/2ML IJ SOLN
INTRAMUSCULAR | Status: AC
  Filled 2020-07-07: qty 2

## 2020-07-07 MED ORDER — LORAZEPAM 2 MG/ML IJ SOLN: 1.0000 mg | Freq: Once | INTRAMUSCULAR | Status: DC | PRN

## 2020-07-07 MED ORDER — DEXAMETHASONE SODIUM PHOSPHATE 10 MG/ML IJ SOLN
INTRAMUSCULAR | Status: DC | PRN
  Administered 2020-07-07: 8 mg via INTRAVENOUS

## 2020-07-07 MED ORDER — PROMETHAZINE HCL 25 MG/ML IJ SOLN: 6.2500 mg | INTRAMUSCULAR | Status: DC | PRN

## 2020-07-07 MED ORDER — FENTANYL CITRATE (PF) 100 MCG/2ML IJ SOLN
INTRAMUSCULAR | Status: AC
  Filled 2020-07-07: qty 2

## 2020-07-07 MED ORDER — SUCCINYLCHOLINE CHLORIDE 20 MG/ML IJ SOLN
INTRAMUSCULAR | Status: DC | PRN
  Administered 2020-07-07: 140 mg via INTRAVENOUS

## 2020-07-07 MED ORDER — LIDOCAINE HCL (PF) 2 % IJ SOLN
INTRAMUSCULAR | Status: AC
  Filled 2020-07-07: qty 5

## 2020-07-07 MED ORDER — PHENYLEPHRINE HCL (PRESSORS) 10 MG/ML IV SOLN
INTRAVENOUS | Status: DC | PRN
  Administered 2020-07-07: 100 ug via INTRAVENOUS
  Administered 2020-07-07 (×2): 200 ug via INTRAVENOUS

## 2020-07-07 MED ORDER — LIDOCAINE-EPINEPHRINE 1 %-1:100000 IJ SOLN
INTRAMUSCULAR | Status: AC
  Filled 2020-07-07: qty 1

## 2020-07-07 MED ORDER — KETAMINE HCL 50 MG/ML IJ SOLN
INTRAMUSCULAR | Status: AC
  Filled 2020-07-07: qty 10

## 2020-07-07 MED ORDER — SEVOFLURANE IN SOLN
RESPIRATORY_TRACT | Status: AC
  Filled 2020-07-07: qty 250

## 2020-07-07 MED ORDER — LIDOCAINE HCL (CARDIAC) PF 100 MG/5ML IV SOSY
PREFILLED_SYRINGE | INTRAVENOUS | Status: DC | PRN
  Administered 2020-07-07: 80 mg via INTRAVENOUS

## 2020-07-07 MED ORDER — PROPOFOL 10 MG/ML IV BOLUS
INTRAVENOUS | Status: DC | PRN
  Administered 2020-07-07: 50 mg via INTRAVENOUS
  Administered 2020-07-07: 100 mg via INTRAVENOUS

## 2020-07-07 MED ORDER — SODIUM CHLORIDE 0.9 % IV SOLN
INTRAVENOUS | Status: DC | PRN
  Administered 2020-07-07: 35 ug/min via INTRAVENOUS

## 2020-07-07 MED ORDER — MEPERIDINE HCL 50 MG/ML IJ SOLN: 6.2500 mg | INTRAMUSCULAR | Status: DC | PRN

## 2020-07-07 MED ORDER — CHLORHEXIDINE GLUCONATE 0.12 % MT SOLN
OROMUCOSAL | Status: AC
  Administered 2020-07-07: 15 mL via OROMUCOSAL
  Filled 2020-07-07: qty 15

## 2020-07-07 MED ORDER — FENTANYL CITRATE (PF) 100 MCG/2ML IJ SOLN
INTRAMUSCULAR | Status: DC | PRN
  Administered 2020-07-07 (×3): 50 ug via INTRAVENOUS

## 2020-07-07 MED ORDER — ROCURONIUM BROMIDE 10 MG/ML (PF) SYRINGE
PREFILLED_SYRINGE | INTRAVENOUS | Status: AC
  Filled 2020-07-07: qty 10

## 2020-07-07 MED ORDER — LIDOCAINE-EPINEPHRINE 1 %-1:100000 IJ SOLN
INTRAMUSCULAR | Status: DC | PRN
  Administered 2020-07-07: 4 mL

## 2020-07-07 MED ORDER — MIDAZOLAM HCL 2 MG/2ML IJ SOLN
INTRAMUSCULAR | Status: DC | PRN
  Administered 2020-07-07: 2 mg via INTRAVENOUS

## 2020-07-07 MED ORDER — ACETAMINOPHEN 10 MG/ML IV SOLN
INTRAVENOUS | Status: AC
  Filled 2020-07-07: qty 100

## 2020-07-07 MED ORDER — DEXMEDETOMIDINE (PRECEDEX) IN NS 20 MCG/5ML (4 MCG/ML) IV SYRINGE
PREFILLED_SYRINGE | INTRAVENOUS | Status: AC
  Filled 2020-07-07: qty 5

## 2020-07-07 MED ORDER — SUCCINYLCHOLINE CHLORIDE 200 MG/10ML IV SOSY
PREFILLED_SYRINGE | INTRAVENOUS | Status: AC
  Filled 2020-07-07: qty 10

## 2020-07-07 MED ORDER — OXYCODONE HCL 5 MG PO TABS: 5.0000 mg | ORAL_TABLET | Freq: Once | ORAL | Status: AC | PRN

## 2020-07-07 MED ORDER — EPHEDRINE 5 MG/ML INJ
INTRAVENOUS | Status: AC
  Filled 2020-07-07: qty 10

## 2020-07-07 MED ORDER — PROPOFOL 10 MG/ML IV BOLUS
INTRAVENOUS | Status: AC
  Filled 2020-07-07: qty 40

## 2020-07-07 MED ORDER — PHENYLEPHRINE HCL (PRESSORS) 10 MG/ML IV SOLN
INTRAVENOUS | Status: AC
  Filled 2020-07-07: qty 1

## 2020-07-07 SURGICAL SUPPLY — 46 items
ADH LQ OCL WTPRF AMP STRL LF (MISCELLANEOUS) ×1
ADH SKN CLS APL DERMABOND .7 (GAUZE/BANDAGES/DRESSINGS) ×1
ADHESIVE MASTISOL STRL (MISCELLANEOUS) ×3 IMPLANT
BLADE SURG 15 STRL LF DISP TIS (BLADE) ×1 IMPLANT
BLADE SURG 15 STRL SS (BLADE) ×3
CANISTER SUCT 1200ML W/VALVE (MISCELLANEOUS) ×3 IMPLANT
CORD BIP STRL DISP 12FT (MISCELLANEOUS) ×3 IMPLANT
COVER WAND RF STERILE (DRAPES) ×3 IMPLANT
DERMABOND ADVANCED (GAUZE/BANDAGES/DRESSINGS) ×2
DERMABOND ADVANCED .7 DNX12 (GAUZE/BANDAGES/DRESSINGS) ×1 IMPLANT
DRAIN TLS ROUND 10FR (DRAIN) IMPLANT
DRAPE MAG INST 16X20 L/F (DRAPES) ×3 IMPLANT
DRAPE SURG 17X11 SM STRL (DRAPES) ×3 IMPLANT
DRSG TEGADERM 2-3/8X2-3/4 SM (GAUZE/BANDAGES/DRESSINGS) ×9 IMPLANT
ELECT CAUTERY BLADE TIP 2.5 (TIP) ×3
ELECT EMG 20MM DUAL (MISCELLANEOUS) ×6
ELECT NEEDLE 20X.3 GREEN (MISCELLANEOUS) ×3
ELECT REM PT RETURN 9FT ADLT (ELECTROSURGICAL) ×3
ELECTRODE CAUTERY BLDE TIP 2.5 (TIP) ×1 IMPLANT
ELECTRODE EMG 20MM DUAL (MISCELLANEOUS) ×2 IMPLANT
ELECTRODE NEEDLE 20X.3 GREEN (MISCELLANEOUS) ×1 IMPLANT
ELECTRODE REM PT RTRN 9FT ADLT (ELECTROSURGICAL) ×1 IMPLANT
FORCEPS JEWEL BIP 4-3/4 STR (INSTRUMENTS) ×3 IMPLANT
GAUZE 4X4 16PLY RFD (DISPOSABLE) ×3 IMPLANT
GLOVE BIO SURGEON STRL SZ7.5 (GLOVE) ×6 IMPLANT
GOWN STRL REUS W/ TWL LRG LVL3 (GOWN DISPOSABLE) ×2 IMPLANT
GOWN STRL REUS W/TWL LRG LVL3 (GOWN DISPOSABLE) ×6
HEMOSTAT SURGICEL 2X3 (HEMOSTASIS) ×3 IMPLANT
HOOK STAY BLUNT/RETRACTOR 5M (MISCELLANEOUS) ×3 IMPLANT
KIT TURNOVER KIT A (KITS) ×3 IMPLANT
LABEL OR SOLS (LABEL) ×3 IMPLANT
MANIFOLD NEPTUNE II (INSTRUMENTS) ×3 IMPLANT
MARKER SKIN DUAL TIP RULER LAB (MISCELLANEOUS) ×3 IMPLANT
NS IRRIG 500ML POUR BTL (IV SOLUTION) ×3 IMPLANT
PACK HEAD/NECK (MISCELLANEOUS) ×3 IMPLANT
PROBE MONO 100X0.75 ELECT 1.9M (MISCELLANEOUS) ×3 IMPLANT
SHEARS HARMONIC 9CM CVD (BLADE) ×3 IMPLANT
SPONGE KITTNER 5P (MISCELLANEOUS) ×3 IMPLANT
STAPLER SKIN PROX 35W (STAPLE) ×3 IMPLANT
SUT PROLENE 5 0 PS 3 (SUTURE) ×3 IMPLANT
SUT SILK 2 0 (SUTURE) ×3
SUT SILK 2-0 30XBRD TIE 12 (SUTURE) ×1 IMPLANT
SUT SILK 3 0 (SUTURE)
SUT SILK 3-0 18XBRD TIE 12 (SUTURE) IMPLANT
SUT VIC AB 4-0 RB1 18 (SUTURE) ×6 IMPLANT
SYSTEM CHEST DRAIN TLS 7FR (DRAIN) IMPLANT

## 2020-07-07 NOTE — Transfer of Care (Signed)
Immediate Anesthesia Transfer of Care Note  Patient: Nancy Campos  Procedure(s) Performed: PAROTIDECTOMY (Right )  Patient Location: PACU  Anesthesia Type:General  Level of Consciousness: drowsy and patient cooperative  Airway & Oxygen Therapy: Patient Spontanous Breathing and Patient connected to face mask oxygen  Post-op Assessment: Report given to RN and Post -op Vital signs reviewed and stable  Post vital signs: Reviewed and stable  Last Vitals:  Vitals Value Taken Time  BP 147/55 07/07/20 0853  Temp    Pulse 87 07/07/20 0855  Resp 21 07/07/20 0855  SpO2 97 % 07/07/20 0855  Vitals shown include unvalidated device data.  Last Pain:  Vitals:   07/07/20 0853  TempSrc:   PainSc: (P) 0-No pain         Complications: No complications documented.

## 2020-07-07 NOTE — Anesthesia Postprocedure Evaluation (Signed)
Anesthesia Post Note  Patient: Nancy Campos  Procedure(s) Performed: PAROTIDECTOMY (Right )  Patient location during evaluation: PACU Anesthesia Type: General Level of consciousness: awake and awake and alert Pain management: pain level controlled Vital Signs Assessment: post-procedure vital signs reviewed and stable Respiratory status: spontaneous breathing Postop Assessment: no apparent nausea or vomiting Anesthetic complications: no   No complications documented.   Last Vitals:  Vitals:   07/07/20 1123 07/07/20 1128  BP: (!) 117/55   Pulse: 79 79  Resp: 13 12  Temp:    SpO2: 96% 96%    Last Pain:  Vitals:   07/07/20 1146  TempSrc:   PainSc: 0-No pain                 Neva Seat

## 2020-07-07 NOTE — Discharge Instructions (Addendum)
Parotidectomy, Care After This sheet gives you information about how to care for yourself after your procedure. Your health care provider may also give you more specific instructions. If you have problems or questions, contact your health care provider. What can I expect after the procedure? After the procedure, it is common to have:  Pain and mild swelling at the incision site.  Numbness along the incision.  Numbness in part or all of your ear.  Mild jaw discomfort on the surgical side when you are eating or chewing. This may last up to 2-4 weeks. Follow these instructions at home: Medicines   Take over-the-counter and prescription medicines only as told by your health care provider.  If you were prescribed an antibiotic medicine, take it as told by your health care provider. Do not stop taking the antibiotic even if you start to feel better.  Ask your health care provider if the medicine prescribed to you: ? Requires you to avoid driving or using heavy machinery. ? Can cause constipation. You may need to take actions to prevent or treat constipation, such as:  Drink enough fluid to keep your urine pale yellow.  Take over-the-counter or prescription medicines.  Eat foods that are high in fiber, such as beans, whole grains, and fresh fruits and vegetables.  Limit foods that are high in fat and processed sugars, such as fried or sweet foods. Incision care   Follow instructions from your health care provider about how to take care of your incision. Make sure you: ? Wash your hands with soap and water before and after you change your bandage (dressing). If soap and water are not available, use hand sanitizer. ? Change your dressing as told by your health care provider. ? Leave stitches (sutures), skin glue, or adhesive strips in place. These skin closures may need to be in place for 2 weeks or longer. If adhesive strip edges start to loosen and curl up, you may trim the loose edges.  Do not remove adhesive strips completely unless your health care provider tells you to do that.  Check your incision area every day for signs of infection. Check for: ? More redness, swelling, or pain. ? Fluid or blood. ? Warmth. ? Pus or a bad smell.  Follow your health care provider's instructions about cleaning and maintaining the drain that was placed near your incision. Eating and drinking  Follow instructions from your health care provider about eating or drinking restrictions.  If your mouth or jaw is sore, try eating soft foods until you feel better. Activity  Return to your normal activities as told by your health care provider. Ask your health care provider what activities are safe for you.  Rest as told by your health care provider.  Avoid sitting for a long time without moving. Get up to take short walks every 1-2 hours. This is important to improve blood flow and breathing. Ask for help if you feel weak or unsteady.  Do not lift anything that is heavier than 10 lb (4.5 kg), or the limit that you are told, until your health care provider says that it is safe. General instructions  Keep your head raised (elevated) when you lie down during the first few weeks after surgery. This will help prevent increased swelling.  Do not take baths, swim, or use a hot tub until your health care provider approves. Ask your health care provider if you may take showers. You may only be allowed to take sponge baths.  Keep all follow-up visits as told by your health care provider. This is important. Contact a health care provider if:  You have pain that does not get better with medicine.  You have more redness, swelling, or pain around your incision.  You have fluid or blood coming from your incision.  Your incision feels warm to the touch.  You have pus or a bad smell coming from your incision.  You vomit or feel nauseous.  You have a fever. Get help right away if:  You have  more pain, swelling, or redness that suddenly gets worse at the incision site.  You have increasing numbness or weakness in your face.  You have severe pain. Summary  After the procedure, it is common to have mild jaw discomfort on the surgical side when you are eating or chewing. This may last up to 2-4 weeks.  Follow instructions from your health care provider about how to take care of your incision.  If your mouth or jaw is sore, try eating soft foods until you feel better.  Return to your normal activities as told by your health care provider. Ask your health care provider what activities are safe for you. This information is not intended to replace advice given to you by your health care provider. Make sure you discuss any questions you have with your health care provider. Document Revised: 05/14/2018 Document Reviewed: 05/16/2018 Elsevier Patient Education  2020 [place]   1) The drugs that you were given will stay in your system until tomorrow so for the next 24 hours you should not:  A) Drive an automobile B) Make any legal decisions C) Drink any alcoholic beverage   2) You may resume regular meals tomorrow.  Today it is better to start with liquids and gradually work up to solid foods.  You may eat anything you prefer, but it is better to start with liquids, then soup and crackers, and gradually work up to solid foods.   3) Please notify your doctor immediately if you have any unusual bleeding, trouble breathing, redness and pain at the surgery site, drainage, fever, or pain not relieved by medication.    4) Additional Instructions:        Please contact your physician with any problems or Same Day Surgery at [phone], Monday through Friday 6 am to 4 pm, or [place] at [facility] number at [phone].

## 2020-07-07 NOTE — OR Nursing (Signed)
Discharge pending transportation 

## 2020-07-07 NOTE — Anesthesia Preprocedure Evaluation (Addendum)
Anesthesia Evaluation  Patient identified by MRN, date of birth, ID band Patient awake    Reviewed: Allergy & Precautions, NPO status , Patient's Chart, lab work & pertinent test results  Airway Mallampati: II       Dental  (+) Partial Upper,    Pulmonary neg pulmonary ROS, Current SmokerPatient did not abstain from smoking.,    Pulmonary exam normal breath sounds clear to auscultation       Cardiovascular hypertension, negative cardio ROS Normal cardiovascular exam Rhythm:Regular Rate:Normal     Neuro/Psych  Headaches, negative psych ROS   GI/Hepatic negative GI ROS, Neg liver ROS,   Endo/Other  negative endocrine ROSdiabetes  Renal/GU negative Renal ROS  negative genitourinary   Musculoskeletal negative musculoskeletal ROS (+)   Abdominal   Peds negative pediatric ROS (+)  Hematology negative hematology ROS (+)   Anesthesia Other Findings .Marland KitchenPast Medical History: No date: Arthritis     Comment:  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 No date: Diabetes mellitus without complication ([facility]) No date: Family history of adverse reaction to anesthesia     Comment:  mother difficulty waking up when she was younger No date: Headache     Comment:  sinus No date: Hypercholesteremia No date: Hypertension No date: Pneumonia     Comment:  2020 bacterial infection in sinuses No date: Wears dentures     Comment:  partial upper  Reproductive/Obstetrics negative OB ROS                            Anesthesia Physical Anesthesia Plan  ASA: III  Anesthesia Plan: General   Post-op Pain Management:    Induction: Intravenous  PONV Risk Score and Plan: 2 and Ondansetron and Dexamethasone  Airway Management Planned: Oral ETT  Additional Equipment:   Intra-op Plan:   Post-operative Plan: Extubation in OR  Informed Consent: I have reviewed the patients History and Physical, chart, labs and discussed the  procedure including the risks, benefits and alternatives for the proposed anesthesia with the patient or authorized representative who has indicated his/her understanding and acceptance.     Dental advisory given  Plan Discussed with: CRNA, Anesthesiologist and Surgeon  Anesthesia Plan Comments:         Anesthesia Quick Evaluation

## 2020-07-07 NOTE — Op Note (Signed)
07/07/2020  8:36 AM    Nancy Campos  [id number]   Pre-Op Dx: neoplasm uncertain behavior, right parotid  Post-op Dx: SAME  Proc: Right superficial parotidectomy without identification of facial nerve; facial nerve monitoring 1 hour  Surg:  Roena Malady   Assistant: Vaught  Anes:  GOT  EBL: Less than 5 cc  Comp: None  Findings: 1.5 x 1.5 cm firm mass tail of parotid adjacent to sternocleidomastoid muscle  Procedure: [name] was identified in the holding area taken the operating room placed in supine position.  After general trach anesthesia the table was turned 90 degrees.  Based on CT scan findings and palpation the neck there was an obvious mass in the tail of parotid adjacent to the sternocleidomastoid muscle posteriorly.  Incision line was marked over the mass in the upper neck.  A local anesthetic of 1% lidocaine with 100,000's epinephrine is used to inject over the mass a total of 4 cc was used.  The facial nerve monitor was applied and left on throughout the procedure.  With the face prepped and draped sterilely 15 blade was used to incise down to and through the superficial muscular aponeurotic system.  The mass was easily identified the posterior aspect of the parotid gland adjacent to the sternocleidomastoid muscle.  Dissection proceeded around the mass using forceps and the harmonic scalpel.  There was mucopurulent discharge which was cultured.  Dissection proceeded around the mass all areas are stimulated with the nerve stimulator prior to dividing them using the harmonic scalpel.  There is a posterior vein which had a draining branch which was divided using harmonic scalpel the sternocleidomastoid muscle was identified posteriorly and the mass was peeled from the muscle and removed in its entirety.  This was sent for permanent section.  The wound was then copiously irrigated with saline.  The SMAS was reapproximated using 4-0 Vicryl the subcutaneous tissues were closed  using 4-0 Vicryl and the skin was closed using Dermabond.  Prior to closure Surgicel was placed in the wound bed after irrigation.  The patient was then awakened in the operating room and taken recovery room in stable condition.  Culture: Parotid mass  Specimen: Parotid mass  Dispo:   Good  Plan: Discharged home follow-up 1 week  Roena Malady  07/07/2020 8:36 AM

## 2020-07-07 NOTE — Anesthesia Procedure Notes (Signed)
Procedure Name: Intubation Date/Time: 07/07/2020 7:39 AM Performed by: Anders Simmonds, RN Pre-anesthesia Checklist: Patient identified, Emergency Drugs available, Suction available and Patient being monitored Patient Re-evaluated:Patient Re-evaluated prior to induction Oxygen Delivery Method: Circle system utilized Preoxygenation: Pre-oxygenation with 100% oxygen Induction Type: IV induction Ventilation: Mask ventilation without difficulty Laryngoscope Size: Miller and 2 Grade View: Grade I Tube type: Oral Tube size: 7.0 mm Number of attempts: 1 Airway Equipment and Method: Stylet Placement Confirmation: ETT inserted through vocal cords under direct vision,  positive ETCO2 and breath sounds checked- equal and bilateral Secured at: 22 cm Tube secured with: Tape Dental Injury: Teeth and Oropharynx as per pre-operative assessment and Injury to lip

## 2020-07-07 NOTE — H&P (Signed)
The patient's history has been reviewed, patient examined, no change in status, stable for surgery.  Questions were answered to the patients satisfaction.  

## 2020-07-07 NOTE — OR Nursing (Addendum)
Patient may resume taking aspirin today per Dr. Tami Ribas, secure chat.  Added to d/c instructions (med section).  MD also advises ok to use [facility]/epic general parotidectomy discharge instructions for patient.

## 2020-07-07 NOTE — Progress Notes (Signed)
Pt up to chair in PACU. Tolerating crackers and beverage. Pt alert and oriented but sleeping between care. Oxygen 90s on 2L [place]; will desat to upper 80s on room air. Incentive spirometer teaching given. Oxygen upper 90s while using IS. VSS. Dr. Addison Bailey notified and verbalized understanding. MD came to bedside to evaluate pt. Verbal orders were given by Dr. Fuller Plan to continue to assess and attempt to wean off oxygen.

## 2020-07-08 ENCOUNTER — Encounter: Payer: Self-pay | Admitting: Unknown Physician Specialty

## 2020-07-08 LAB — SURGICAL PATHOLOGY

## 2020-07-12 LAB — AEROBIC/ANAEROBIC CULTURE W GRAM STAIN (SURGICAL/DEEP WOUND): Culture: NO GROWTH

## 2020-08-12 ENCOUNTER — Other Ambulatory Visit: Payer: Self-pay | Admitting: Nurse Practitioner

## 2020-08-12 DIAGNOSIS — Z1231 Encounter for screening mammogram for malignant neoplasm of breast: Secondary | ICD-10-CM

## 2020-09-03 ENCOUNTER — Other Ambulatory Visit: Payer: Self-pay

## 2020-09-03 ENCOUNTER — Ambulatory Visit
Admission: RE | Admit: 2020-09-03 | Discharge: 2020-09-03 | Disposition: A | Discharge status: Home / Self Care | Payer: Managed Care, Other (non HMO) | Source: Ambulatory Visit | Attending: Nurse Practitioner | Admitting: Nurse Practitioner

## 2020-09-03 DIAGNOSIS — Z1231 Encounter for screening mammogram for malignant neoplasm of breast: Secondary | ICD-10-CM | POA: Insufficient documentation

## 2020-10-03 ENCOUNTER — Encounter: Payer: Self-pay | Admitting: Emergency Medicine

## 2020-10-03 ENCOUNTER — Ambulatory Visit
Admission: EM | Admit: 2020-10-03 | Discharge: 2020-10-03 | Disposition: A | Discharge status: Home / Self Care | Payer: Managed Care, Other (non HMO) | Attending: Emergency Medicine | Admitting: Emergency Medicine

## 2020-10-03 ENCOUNTER — Other Ambulatory Visit: Payer: Self-pay

## 2020-10-03 DIAGNOSIS — B372 Candidiasis of skin and nail: Secondary | ICD-10-CM

## 2020-10-03 MED ORDER — FLUCONAZOLE 150 MG PO TABS: ORAL_TABLET | ORAL | 0 refills | Status: AC

## 2020-10-03 MED ORDER — KETOCONAZOLE POWD: 0 refills | Status: AC

## 2020-10-03 NOTE — ED Triage Notes (Addendum)
Pt states there is an area to her c section wound from 1995 that looks like it may be open a little.  States it was stinging when she got in the shower.  Area is reddened above and below scar but does not appear to be affecting the incision scar.

## 2020-10-03 NOTE — ED Provider Notes (Signed)
[name]   [id number] 10/03/20 Arrival Time: 4166   Chief Complaint  Patient presents with  . Wound Check     SUBJECTIVE: History from: patient.  Nancy Campos is a 60 y.o. female with history of diabetes type 2 presented to the urgent care with a complaint of yeast infection to abdomen fold for the past few days.  Report area is red and irritated.  Denies any precipitating event.  Has tried OTC medication without relief..  Denies alleviating or aggravating factor.  Denies similar symptoms in the past.  Denies chills, fever, nausea, vomiting, diarrhea..   ROS: As per HPI.  All other pertinent ROS negative.      Past Medical History:  Diagnosis Date  . 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 Diabetes mellitus without complication ([place])   . Family history of adverse reaction to anesthesia    mother difficulty waking up when she was younger  . Headache    sinus  . Hypercholesteremia   . Hypertension   . Pneumonia    2020 bacterial infection in sinuses  . Wears dentures    partial upper   Past Surgical History:  Procedure Laterality Date  . ABDOMINAL HYSTERECTOMY  1997  . BREAST BIOPSY Right 2013   benign  . BREAST EXCISIONAL BIOPSY Right 2007   benign  . CHOLECYSTECTOMY     1998  . COLONOSCOPY    . ESOPHAGOGASTRODUODENOSCOPY    . ETHMOIDECTOMY Bilateral 08/30/2019   Procedure: ETHMOIDECTOMY;  Surgeon: Beverly Gust, MD;  Location: [place];  Service: ENT;  Laterality: Bilateral;  . FRONTAL SINUS EXPLORATION Bilateral 08/30/2019   Procedure: FRONTAL SINUS EXPLORATION;  Surgeon: Beverly Gust, MD;  Location: [place];  Service: ENT;  Laterality: Bilateral;  . IMAGE GUIDED SINUS SURGERY N/A 08/30/2019   Procedure: IMAGE GUIDED SINUS SURGERY;  Surgeon: Beverly Gust, MD;  Location: [place];  Service: ENT;  Laterality: N/A;  needs disk gave 2nd disk to Manuela Schwartz 1.13.21 ds Diabetic - oral meds  . MAXILLARY ANTROSTOMY  Bilateral 08/30/2019   Procedure: MAXILLARY ANTROSTOMY WITH TISSUE REMOVAL;  Surgeon: Beverly Gust, MD;  Location: [place];  Service: ENT;  Laterality: Bilateral;  . NASAL SEPTOPLASTY W/ TURBINOPLASTY Bilateral 08/30/2019   Procedure: NASAL SEPTOPLASTY WITH TURBINATE REDUCTION;  Surgeon: Beverly Gust, MD;  Location: [place];  Service: ENT;  Laterality: Bilateral;  . PAROTIDECTOMY Right 07/07/2020   Procedure: PAROTIDECTOMY;  Surgeon: Beverly Gust, MD;  Location: [facility];  Service: ENT;  Laterality: Right;   Allergies  Allergen Reactions  . Penicillins Anaphylaxis    1983 - received 2 injections in each hip. Afterwards was hospitalized - unable to stand  . Influenza Virus Vaccine   . Sulfa Antibiotics     Topical sulfa cream applied to burn caused skin to turn black   No current facility-administered medications on file prior to encounter.   Current Outpatient Medications on File Prior to Encounter  Medication Sig Dispense Refill  . aspirin EC 81 MG tablet Take 81 mg by mouth in the morning. Swallow whole.    . chlorthalidone (HYGROTON) 25 MG tablet Take 25 mg by mouth daily.    . Clobetasol Propionate 0.05 % lotion Apply 1 application topically daily as needed (psoriasis).     . fluticasone (FLONASE) 50 MCG/ACT nasal spray Place 2 sprays into both nostrils daily as needed for allergies or rhinitis.     Marland Kitchen HYDROcodone-Acetaminophen 5-300 MG TABS Take 1-2 tablets  by mouth every 4 (four) hours as needed. 30 tablet 0  . HYDROcodone-Acetaminophen 5-300 MG TABS Take 1-2 tablets by mouth every 4 (four) hours as needed. 30 tablet 0  . Lemborexant (DAYVIGO) 5 MG TABS Take 5 mg by mouth at bedtime.     Marland Kitchen losartan (COZAAR) 100 MG tablet Take 100 mg by mouth daily.    . metFORMIN (GLUCOPHAGE) 500 MG tablet Take 1 tablet (500 mg total) by mouth 2 (two) times daily with a meal. 60 tablet 0  . Multiple Vitamin (MULTIVITAMIN) tablet Take 1 tablet by mouth in the morning.      Vladimir Faster Glycol-Propyl Glycol (SYSTANE) 0.4-0.3 % SOLN Apply 1 drop to eye daily as needed (for dry eye relief).    . Potassium 99 MG TABS Take 99 mg by mouth daily.    . potassium chloride (KLOR-CON) 10 MEQ tablet Take 10 mEq by mouth daily. (Patient not taking: Reported on 06/24/2020)    . rosuvastatin (CRESTOR) 5 MG tablet Take 5 mg by mouth at bedtime.     . sertraline (ZOLOFT) 100 MG tablet Take 100 mg by mouth in the morning.     . Vitamin D3 (VITAMIN D) 25 MCG tablet Take 1,000 Units by mouth in the morning.      Social History   Socioeconomic History  . Marital status: Widowed    Spouse name: Not on file  . Number of children: 2  . Years of education: Not on file  . Highest education level: Not on file  Occupational History  . Not on file  Tobacco Use  . Smoking status: Current Every Day Smoker    Packs/day: 0.50    Years: 25.00    Pack years: 12.50  . Smokeless tobacco: Never Used  . Tobacco comment: quit 2012 after 25 yrs/0.5 PPD.  Restart 2017 - 4 cigs /day  Vaping Use  . Vaping Use: Never used  Substance and Sexual Activity  . Alcohol use: Not Currently  . Drug use: Never  . Sexual activity: Not on file  Other Topics Concern  . Not on file  Social History Narrative  . Not on file   Social Determinants of Health   Financial Resource Strain: Not on file  Food Insecurity: Not on file  Transportation Needs: Not on file  Physical Activity: Not on file  Stress: Not on file  Social Connections: Not on file  Intimate Partner Violence: Not on file   Family History  Problem Relation Age of Onset  . Breast cancer Other   . Breast cancer Sister     OBJECTIVE:  Vitals:   10/03/20 1426  BP: 121/83  Pulse: 96  Resp: 19  Temp: (!) 97.2 F (36.2 C)  TempSrc: Oral  SpO2: 95%      Physical Exam Vitals and nursing note reviewed.  Constitutional:      General: She is not in acute distress.    Appearance: Normal appearance. She is normal weight. She  is not ill-appearing, toxic-appearing or diaphoretic.  HENT:     Head: Normocephalic.  Cardiovascular:     Rate and Rhythm: Normal rate and regular rhythm.     Pulses: Normal pulses.     Heart sounds: Normal heart sounds. No murmur heard. No friction rub. No gallop.   Pulmonary:     Effort: Pulmonary effort is normal. No respiratory distress.     Breath sounds: Normal breath sounds. No stridor. No wheezing, rhonchi or rales.  Chest:  Chest wall: No tenderness.  Skin:    General: Skin is warm.     Findings: Rash present. Rash is scaling.  Neurological:     Mental Status: She is alert and oriented to person, place, and time.     LABS:  No results found for this or any previous visit (from the past 24 hour(s)).   ASSESSMENT & PLAN:  1. Skin yeast infection     Meds ordered this encounter  Medications  . KETOCONAZOLE, BULK, POWD    Sig: Apply small amount topically to to affected area twice a day.    Dispense:  25 g    Refill:  0  . fluconazole (DIFLUCAN) 150 MG tablet    Sig: Take 1 tablet by mouth every 3 days    Dispense:  2 tablet    Refill:  0   Discharge instructions  Clean area with warm water and mild soap and pat dry Apply nystatin powder as prescribed May use absorbent material clothing such as cotton or wall Take Diflucan as prescribed/advised patient to stop taking Dayvigo until completion of Diflucan Follow-up with PCP Return or go to ED if you develop any new or worsening of your symptoms  Reviewed expectations re: course of current medical issues. Questions answered. Outlined signs and symptoms indicating need for more acute intervention. Patient verbalized understanding. After Visit Summary given.         Emerson Monte, FNP 10/03/20 [phone]

## 2020-10-03 NOTE — Discharge Instructions (Signed)
Clean area with warm water and mild soap and pat dry Apply nystatin powder as prescribed May use absorbent material clothing such as cotton or wall Take Diflucan as prescribed/advised patient to stop taking Dayvigo until completion of Diflucan Follow-up with PCP Return or go to ED if you develop any new or worsening of your symptoms

## 2020-10-31 ENCOUNTER — Telehealth: Payer: Self-pay | Admitting: Internal Medicine

## 2020-10-31 MED ORDER — MICONAZOLE NITRATE 2 % EX AERP: INHALATION_SPRAY | CUTANEOUS | 0 refills | Status: AC

## 2020-10-31 NOTE — Telephone Encounter (Signed)
Miconazole 2% powder is sent to pharmacy.

## 2022-05-16 IMAGING — MG MM DIGITAL SCREENING BILAT W/ TOMO AND CAD
8 series · 8 of 24 positions shown · non-contrast
Comparison: Previous exam(s).

CLINICAL DATA: Screening.

EXAM:
DIGITAL SCREENING BILATERAL MAMMOGRAM WITH TOMO AND CAD

[L CC synth-2D]
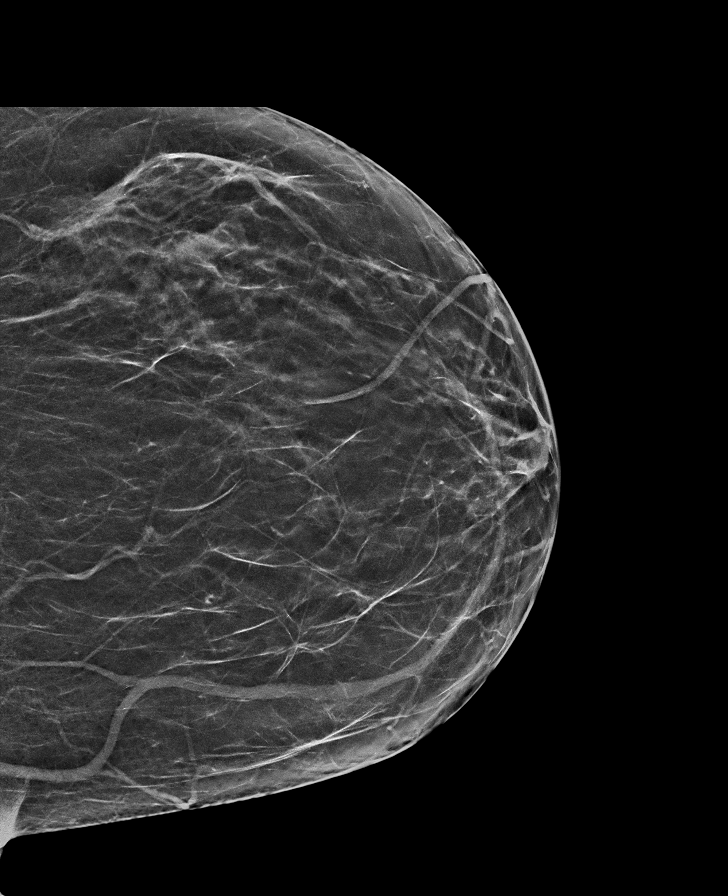

[R CC synth-2D]
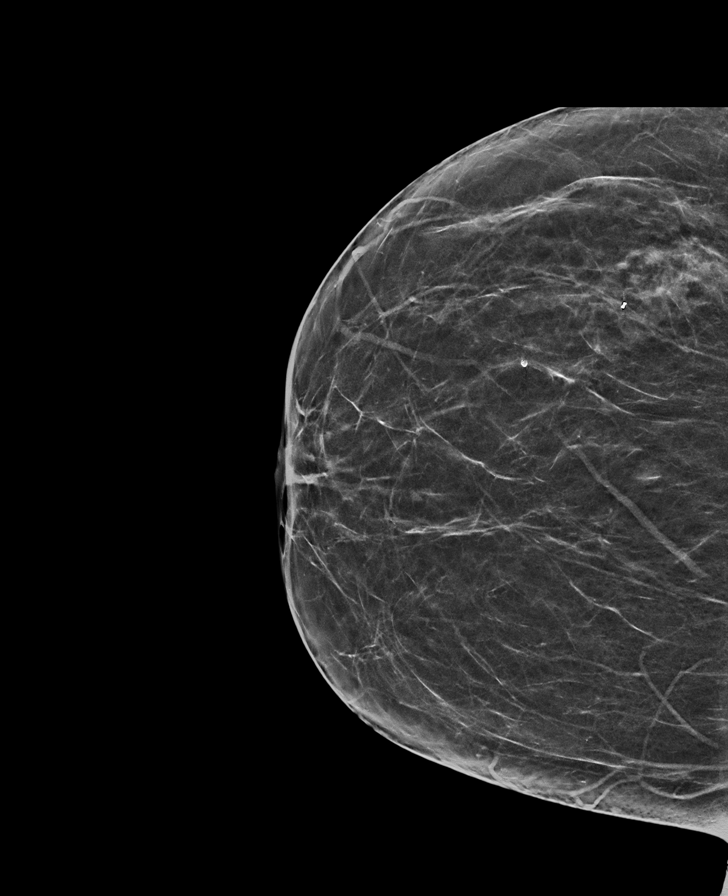

[L MLO synth-2D]
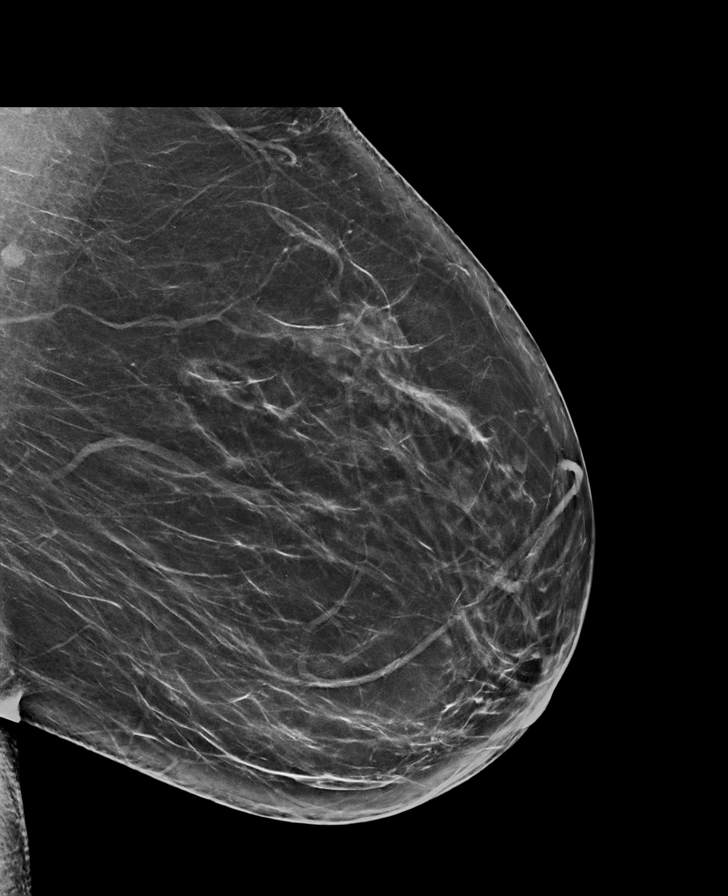

[R MLO synth-2D]
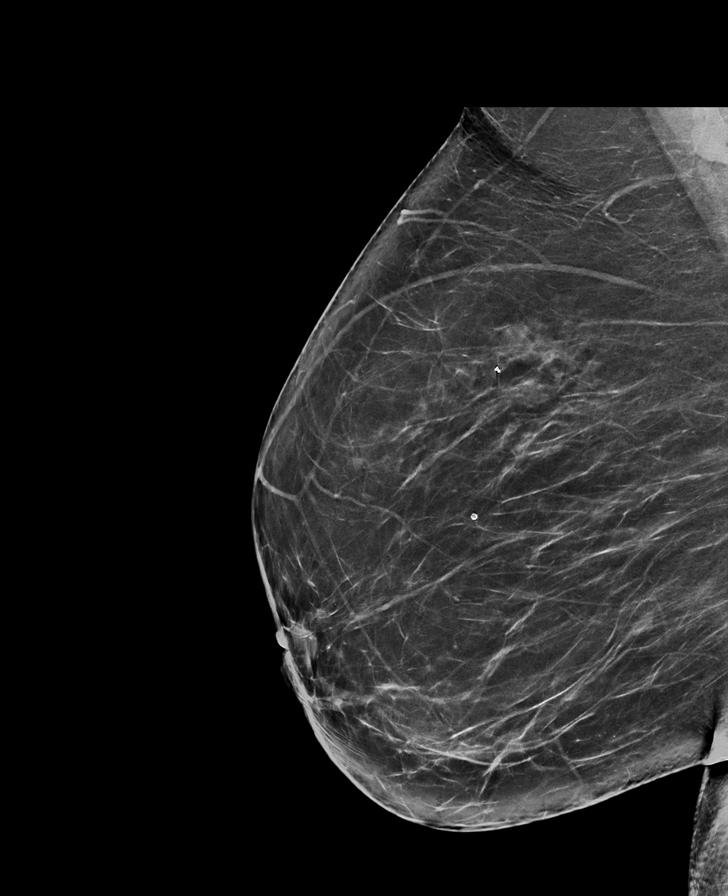

[L MLO tomo · tomo slice 39/77.0]
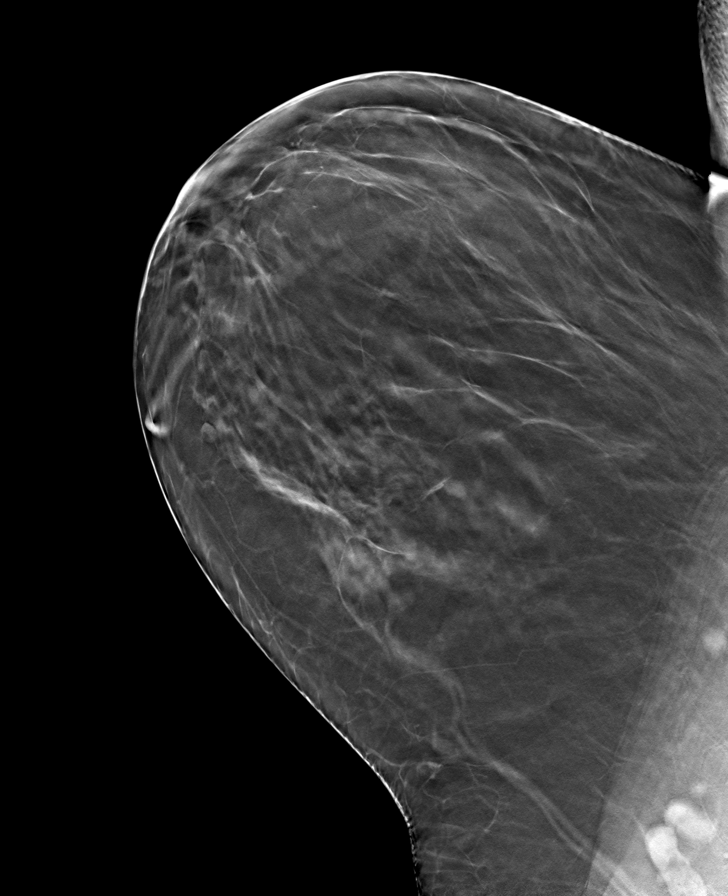

[R MLO tomo · tomo slice 39/77.0]
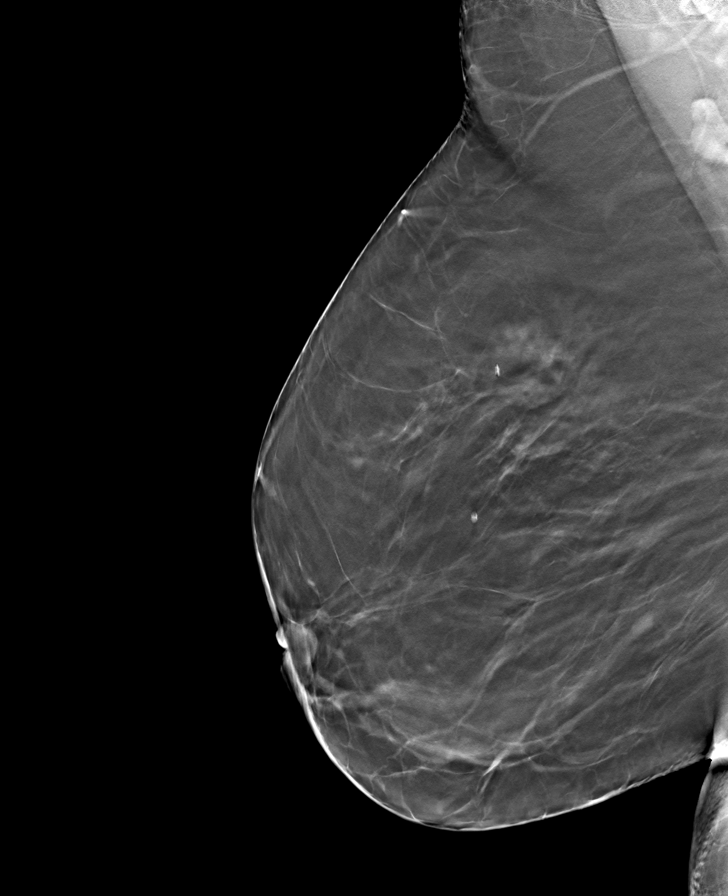

[R CC tomo · tomo slice 33/64.0]
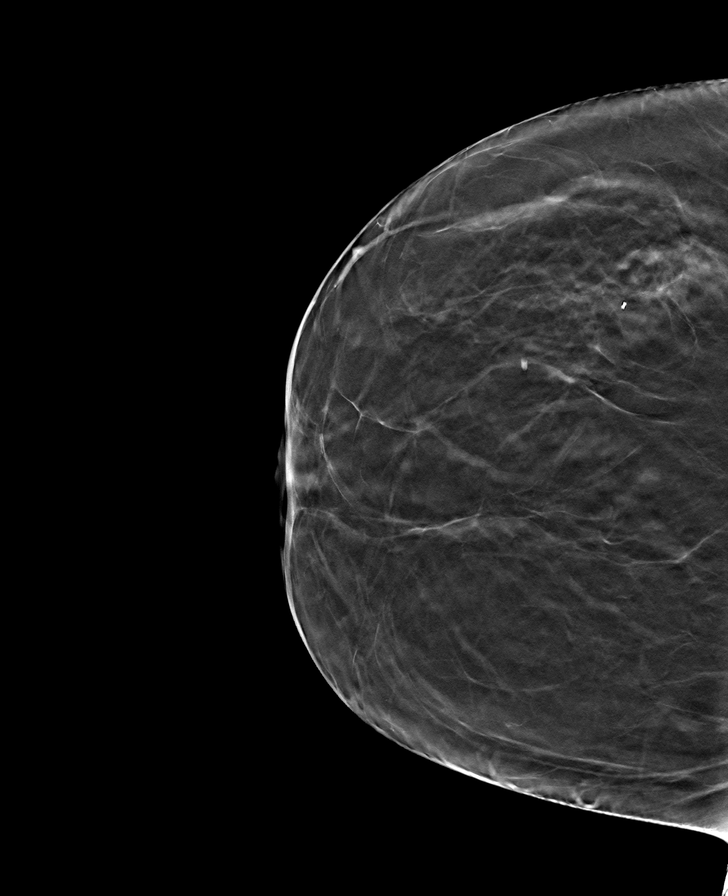

[L CC tomo · tomo slice 33/66.0]
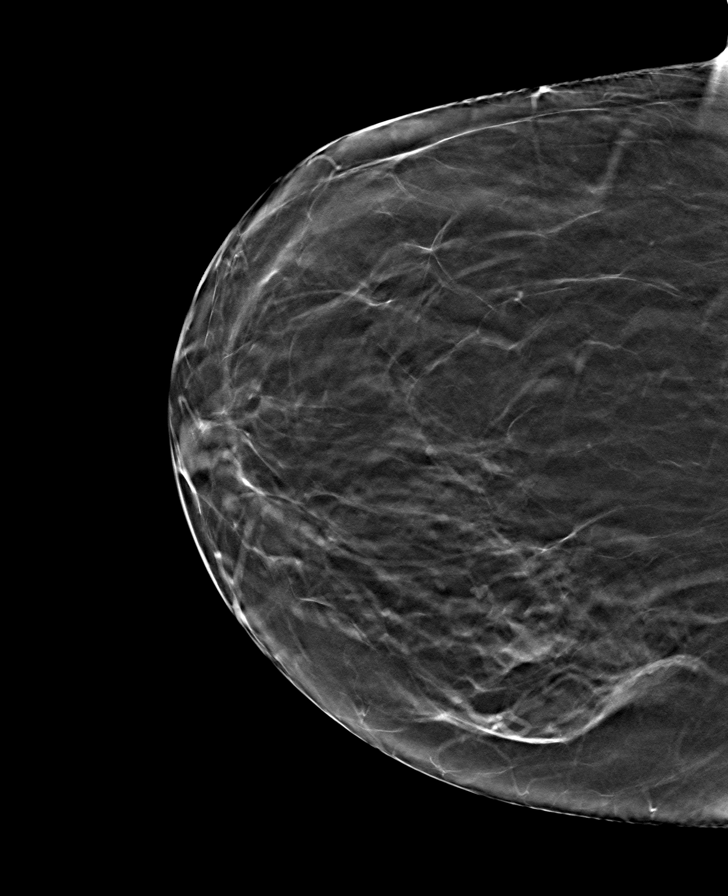

[8 of 24 positions shown; findings below may reference images not displayed]

ACR Breast Density Category b: There are scattered areas of
fibroglandular density.
FINDINGS: There are no findings suspicious for malignancy. The images were
evaluated with computer-aided detection.
IMPRESSION: No mammographic evidence of malignancy. A result letter of this
screening mammogram will be mailed directly to the patient.

RECOMMENDATION:
Screening mammogram in one year. (Code:ZP-[id number])

BI-RADS CATEGORY  1: Negative.
